# Patient Record
Sex: Male | Born: 1955
Health system: Southern US, Community
[De-identification: ages and names within clinical notes are randomized; demographics above are authoritative.]

## PROBLEM LIST (undated history)

## (undated) DIAGNOSIS — K760 Fatty (change of) liver, not elsewhere classified: Secondary | ICD-10-CM

## (undated) DIAGNOSIS — R748 Abnormal levels of other serum enzymes: Secondary | ICD-10-CM

## (undated) DIAGNOSIS — T7840XA Allergy, unspecified, initial encounter: Secondary | ICD-10-CM

## (undated) DIAGNOSIS — I10 Essential (primary) hypertension: Secondary | ICD-10-CM

## (undated) DIAGNOSIS — M109 Gout, unspecified: Secondary | ICD-10-CM

## (undated) DIAGNOSIS — Z8619 Personal history of other infectious and parasitic diseases: Secondary | ICD-10-CM

## (undated) DIAGNOSIS — H269 Unspecified cataract: Secondary | ICD-10-CM

## (undated) HISTORY — DX: Essential (primary) hypertension: I10

## (undated) HISTORY — DX: Fatty (change of) liver, not elsewhere classified: K76.0

## (undated) HISTORY — DX: Allergy, unspecified, initial encounter: T78.40XA

## (undated) HISTORY — DX: Gout, unspecified: M10.9

## (undated) HISTORY — DX: Abnormal levels of other serum enzymes: R74.8

## (undated) HISTORY — DX: Personal history of other infectious and parasitic diseases: Z86.19

## (undated) HISTORY — DX: Unspecified cataract: H26.9

## (undated) HISTORY — PX: APPENDECTOMY: SHX54

---

## 1983-06-21 HISTORY — PX: KNEE SURGERY: SHX244

## 2017-12-19 ENCOUNTER — Telehealth: Payer: Self-pay

## 2017-12-19 NOTE — Telephone Encounter (Signed)
Copied from CRM [id number]. Topic: Appointment Scheduling - Scheduling Inquiry for Clinic >> Dec 19, 2017 11:51 AM Oneal GroutSebastian, Jennifer S wrote: Reason for CRM: New pt appt scheduled for Sept w/ Dr Judie GrieveMclean Scocuzza, has bruising on chest, would like to be worked in next week. Please advise

## 2017-12-19 NOTE — Telephone Encounter (Signed)
Left message for patient to return call back. [facility] may schedulea sooner appointment.  As long as it is in a 30 minutes slot.

## 2017-12-25 ENCOUNTER — Other Ambulatory Visit: Payer: Self-pay | Admitting: Internal Medicine

## 2017-12-25 ENCOUNTER — Ambulatory Visit (INDEPENDENT_AMBULATORY_CARE_PROVIDER_SITE_OTHER): Payer: Managed Care, Other (non HMO)

## 2017-12-25 ENCOUNTER — Encounter: Payer: Self-pay | Admitting: Internal Medicine

## 2017-12-25 ENCOUNTER — Ambulatory Visit (INDEPENDENT_AMBULATORY_CARE_PROVIDER_SITE_OTHER): Payer: Managed Care, Other (non HMO) | Admitting: Internal Medicine

## 2017-12-25 VITALS — BP 164/98 | HR 104 | Temp 98.6°F | Ht 71.0 in | Wt 169.0 lb

## 2017-12-25 DIAGNOSIS — M109 Gout, unspecified: Secondary | ICD-10-CM

## 2017-12-25 DIAGNOSIS — T148XXA Other injury of unspecified body region, initial encounter: Secondary | ICD-10-CM

## 2017-12-25 DIAGNOSIS — R600 Localized edema: Secondary | ICD-10-CM | POA: Diagnosis not present

## 2017-12-25 DIAGNOSIS — M1A9XX0 Chronic gout, unspecified, without tophus (tophi): Secondary | ICD-10-CM

## 2017-12-25 DIAGNOSIS — Z720 Tobacco use: Secondary | ICD-10-CM | POA: Diagnosis not present

## 2017-12-25 DIAGNOSIS — Z1329 Encounter for screening for other suspected endocrine disorder: Secondary | ICD-10-CM | POA: Diagnosis not present

## 2017-12-25 DIAGNOSIS — F1010 Alcohol abuse, uncomplicated: Secondary | ICD-10-CM | POA: Diagnosis not present

## 2017-12-25 DIAGNOSIS — I10 Essential (primary) hypertension: Secondary | ICD-10-CM | POA: Diagnosis not present

## 2017-12-25 DIAGNOSIS — Z125 Encounter for screening for malignant neoplasm of prostate: Secondary | ICD-10-CM | POA: Diagnosis not present

## 2017-12-25 DIAGNOSIS — R748 Abnormal levels of other serum enzymes: Secondary | ICD-10-CM

## 2017-12-25 LAB — COMPREHENSIVE METABOLIC PANEL
ALT: 93 U/L — ABNORMAL HIGH (ref 0–53)
AST: 176 U/L — AB (ref 0–37)
Albumin: 4.4 g/dL (ref 3.5–5.2)
Alkaline Phosphatase: 92 U/L (ref 39–117)
BUN: 8 mg/dL (ref 6–23)
CALCIUM: 9.5 mg/dL (ref 8.4–10.5)
CO2: 27 meq/L (ref 19–32)
CREATININE: 0.84 mg/dL (ref 0.40–1.50)
Chloride: 99 mEq/L (ref 96–112)
GFR: 97.79 mL/min (ref >60.00)
Glucose, Bld: 122 mg/dL — ABNORMAL HIGH (ref 70–99)
Potassium: 3.6 mEq/L (ref 3.5–5.1)
Sodium: 140 mEq/L (ref 135–145)
Total Bilirubin: 0.6 mg/dL (ref 0.2–1.2)
Total Protein: 6.8 g/dL (ref 6.0–8.3)

## 2017-12-25 LAB — CBC WITH DIFFERENTIAL/PLATELET
Basophils Absolute: 0.1 10*3/uL (ref 0.0–0.1)
Basophils Relative: 1 % (ref 0.0–3.0)
Eosinophils Absolute: 0 10*3/uL (ref 0.0–0.7)
Eosinophils Relative: 0.2 % (ref 0.0–5.0)
HEMATOCRIT: 39.3 % (ref 39.0–52.0)
Hemoglobin: 13.6 g/dL (ref 13.0–17.0)
LYMPHS PCT: 13.5 % (ref 12.0–46.0)
Lymphs Abs: 0.8 10*3/uL (ref 0.7–4.0)
MCHC: 34.6 g/dL (ref 30.0–36.0)
MONOS PCT: 12.2 % — AB (ref 3.0–12.0)
Monocytes Absolute: 0.8 10*3/uL (ref 0.1–1.0)
NEUTROS ABS: 4.6 10*3/uL (ref 1.4–7.7)
NEUTROS PCT: 73.1 % (ref 43.0–77.0)
PLATELETS: 232 10*3/uL (ref 150.0–400.0)
RBC: 3.47 Mil/uL — ABNORMAL LOW (ref 4.22–5.81)
RDW: 15.6 % — AB (ref 11.5–15.5)
WBC: 6.2 10*3/uL (ref 4.0–10.5)

## 2017-12-25 LAB — TSH: TSH: 3.42 u[IU]/mL (ref 0.35–4.50)

## 2017-12-25 LAB — PROTIME-INR
INR: 1 ratio (ref 0.8–1.0)
PROTHROMBIN TIME: 11.9 s (ref 9.6–13.1)

## 2017-12-25 LAB — PSA: PSA: 1 ng/mL (ref 0.10–4.00)

## 2017-12-25 LAB — URIC ACID: URIC ACID, SERUM: 7 mg/dL (ref 4.0–7.8)

## 2017-12-25 LAB — APTT: APTT: 29.3 s (ref 23.4–32.7)

## 2017-12-25 MED ORDER — SPIRONOLACTONE 25 MG PO TABS: ORAL_TABLET | ORAL | 1 refills | Status: DC

## 2017-12-25 MED ORDER — FUROSEMIDE 20 MG PO TABS: 20.0000 mg | ORAL_TABLET | Freq: Every day | ORAL | 1 refills | Status: DC

## 2017-12-25 NOTE — Progress Notes (Addendum)
Chief Complaint  Patient presents with  . New Patient (Initial Visit)  . Leg Swelling  . Bleeding/Bruising   NP has not seen PCP in 8 years  1. C/o bruising to right chest and abdomen x 2 weeks ago and right elbow whole/sore which was round and bloody 2 weeks ago using neosporin. Denies h/o trauma. He also had scratch to his back w/o h/o trauma or bite, no pets at home  2. BP elevated 180/106 he has been told he had HTN in the past not seen MD in 8 years and not currently taking medications or symptomatic  3.drinking 1 bottle wine 3/7 nights a week and tobacco abuse 1/2 ppd since childhood 62 y.o quit x 10 years 2005 to 2015 no FH known adopted  4. C/o right leg edema w/in the the last 2 weeks no trauma  5. H/o gout not on meds   Review of Systems  Constitutional: Negative for weight loss.  HENT: Negative for hearing loss.   Eyes: Negative for blurred vision.  Respiratory: Negative for shortness of breath.   Cardiovascular: Positive for leg swelling. Negative for chest pain.  Gastrointestinal: Negative for abdominal pain and blood in stool.  Musculoskeletal: Negative for falls.  Skin:       +wounds skin  Neurological: Negative for headaches.  Endo/Heme/Allergies: Bruises/bleeds easily.  Psychiatric/Behavioral: The patient is nervous/anxious.    Past Medical History:  Diagnosis Date  . Allergy   . Gout   . History of chicken pox   . Hypertension    Past Surgical History:  Procedure Laterality Date  . APPENDECTOMY     1960  . KNEE SURGERY  1985   arthroscopy    Family History  Adopted: Yes   Social History   Socioeconomic History  . Marital status: Divorced    Spouse name: Not on file  . Number of children: Not on file  . Years of education: Not on file  . Highest education level: Not on file  Occupational History  . Not on file  Social Needs  . Financial resource strain: Not on file  . Food insecurity:    Worry: Not on file    Inability: Not on file  .  Transportation needs:    Medical: Not on file    Non-medical: Not on file  Tobacco Use  . Smoking status: Current Every Day Smoker    Packs/day: 0.50    Types: Cigarettes  . Smokeless tobacco: Never Used  Substance and Sexual Activity  . Alcohol use: Yes    Comment: 1 bottle wine 3/7 nights a week   . Drug use: Not Currently  . Sexual activity: Yes    Partners: Female  Lifestyle  . Physical activity:    Days per week: Not on file    Minutes per session: Not on file  . Stress: Not on file  Relationships  . Social connections:    Talks on phone: Not on file    Gets together: Not on file    Attends religious service: Not on file    Active member of club or organization: Not on file    Attends meetings of clubs or organizations: Not on file    Relationship status: Not on file  . Intimate partner violence:    Fear of current or ex partner: Not on file    Emotionally abused: Not on file    Physically abused: Not on file    Forced sexual activity: Not on file  Other Topics Concern  . Not on file  Social History Narrative   Separated    2 kids son and daughter    Event organiser    Current Meds  Medication Sig  . Multiple Vitamins-Minerals (MULTIVITAMIN ADULT PO) Take by mouth.  . NON FORMULARY Tumeric qd   Allergies  Allergen Reactions  . Penicillin G Other (See Comments)    As kid    No results found for this or any previous visit (from the past 2160 hour(s)). Objective  Body mass index is 23.57 kg/m. Wt Readings from Last 3 Encounters:  12/25/17 169 lb (76.7 kg)   Temp Readings from Last 3 Encounters:  12/25/17 98.6 F (37 C) (Oral)   BP Readings from Last 3 Encounters:  12/25/17 (!) 164/98   Pulse Readings from Last 3 Encounters:  12/25/17 (!) 104    Physical Exam  Constitutional: He is oriented to person, place, and time. He appears well-developed and well-nourished. He is cooperative.  HENT:  Head: Normocephalic and atraumatic.    Mouth/Throat: Oropharynx is clear and moist and mucous membranes are normal.  Eyes: Pupils are equal, round, and reactive to light. Conjunctivae are normal.  Cardiovascular: Regular rhythm and normal heart sounds. Tachycardia present.  Pulmonary/Chest: Effort normal and breath sounds normal.  Abdominal: Soft. Bowel sounds are normal. There is no tenderness.    Neurological: He is alert and oriented to person, place, and time. Gait normal.  Skin: Skin is warm, dry and intact.     Psychiatric: He has a normal mood and affect. His speech is normal and behavior is normal. Judgment and thought content normal. Cognition and memory are normal.  Nursing note and vitals reviewed.   Assessment   1. HTN  2. 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 Alcohol and tobacco abuse  4. RLE edema  5. HM 6. gout Plan  1. spironlactone 12.5 mg x 3 days then 25 mg qd and lasix 20 mg qd  Check labs  2. Check labs  With h/o etoh abuse consider [place] abd 3. rec reduce etoh and cigs 4. D dimer if elevated will do [place] RLE 5.  Had flu shot 2018  Consider Tdap disc today  Will disc shingrix and pna 23 vaccine in future  Consider colonoscopy pt agreeable in future   Labs today consider lipid fasting in future  Declines hep B/C/HIV testing  Smoker 1/2 ppd since childhood 62 y.o quit x 10 years 2005 to 2015 no FH known adopted  rec reduce etoh  PHQ 9 score 11 address at f/u may be related to substance   Saw eye MD Dr. Lew Dawes  6. Check uric acid   Provider: Dr. French Ana McLean-Scocuzza-Internal Medicine

## 2017-12-25 NOTE — Patient Instructions (Signed)
F/u in 2-3 weeks sooner if needed  Read info below  We will start 1/2 spironolactone x 3 days in am then 1 pill on day 4  Lasix 20 mg in am   Low-Purine Diet Purines are compounds that affect the level of uric acid in your body. A low-purine diet is a diet that is low in purines. Eating a low-purine diet can prevent the level of uric acid in your body from getting too high and causing gout or kidney stones or both. What do I need to know about this diet?  Choose low-purine foods. Examples of low-purine foods are listed in the next section.  Drink plenty of fluids, especially water. Fluids can help remove uric acid from your body. Try to drink 8-16 cups (1.9-3.8 L) a day.  Limit foods high in fat, especially saturated fat, as fat makes it harder for the body to get rid of uric acid. Foods high in saturated fat include pizza, cheese, ice cream, whole milk, fried foods, and gravies. Choose foods that are lower in fat and lean sources of protein. Use olive oil when cooking as it contains healthy fats that are not high in saturated fat.  Limit alcohol. Alcohol interferes with the elimination of uric acid from your body. If you are having a gout attack, avoid all alcohol.  Keep in mind that different people's bodies react differently to different foods. You will probably learn over time which foods do or do not affect you. If you discover that a food tends to cause your gout to flare up, avoid eating that food. You can more freely enjoy foods that do not cause problems. If you have any questions about a food item, talk to your dietitian or health care provider. Which foods are low, moderate, and high in purines? The following is a list of foods that are low, moderate, and high in purines. You can eat any amount of the foods that are low in purines. You may be able to have small amounts of foods that are moderate in purines. Ask your health care provider how much of a food moderate in purines you can  have. Avoid foods high in purines. Grains  Foods low in purines: Enriched white bread, pasta, rice, cake, cornbread, popcorn.  Foods moderate in purines: Whole-grain breads and cereals, wheat germ, bran, oatmeal. Uncooked oatmeal. Dry wheat bran or wheat germ.  Foods high in purines: Pancakes, [place] toast, biscuits, muffins. Vegetables  Foods low in purines: All vegetables, except those that are moderate in purines.  Foods moderate in purines: Asparagus, cauliflower, spinach, mushrooms, green peas. Fruits  All fruits are low in purines. Meats and other Protein Foods  Foods low in purines: Eggs, nuts, peanut butter.  Foods moderate in purines: 80-90% lean beef, lamb, veal, pork, poultry, fish, eggs, peanut butter, nuts. Crab, lobster, oysters, and shrimp. Cooked dried beans, peas, and lentils.  Foods high in purines: Anchovies, sardines, herring, mussels, tuna, codfish, scallops, trout, and haddock. Vincent Hayes. Organ meats (such as liver or kidney). Tripe. Game meat. Goose. Sweetbreads. Dairy  All dairy foods are low in purines. Low-fat and fat-free dairy products are best because they are low in saturated fat. Beverages  Drinks low in purines: Water, carbonated beverages, tea, coffee, cocoa.  Drinks moderate in purines: Soft drinks and other drinks sweetened with high-fructose corn syrup. Juices. To find whether a food or drink is sweetened with high-fructose corn syrup, look at the ingredients list.  Drinks high in purines: Alcoholic beverages (  such as beer). Condiments  Foods low in purines: Salt, herbs, olives, pickles, relishes, vinegar.  Foods moderate in purines: Butter, margarine, oils, mayonnaise. Fats and Oils  Foods low in purines: All types, except gravies and sauces made with meat.  Foods high in purines: Gravies and sauces made with meat. Other Foods  Foods low in purines: Sugars, sweets, gelatin. Cake. Soups made without meat.  Foods moderate in purines:  Meat-based or fish-based soups, broths, or bouillons. Foods and drinks sweetened with high-fructose corn syrup.  Foods high in purines: High-fat desserts (such as ice cream, cookies, cakes, pies, doughnuts, and chocolate). Contact your dietitian for more information on foods that are not listed here. This information is not intended to replace advice given to you by your health care provider. Make sure you discuss any questions you have with your health care provider. Document Released: 10/01/2010 Document Revised: 11/12/2015 Document Reviewed: 05/13/2013 Elsevier Interactive Patient Education  2017 Else[employer] Inc.  Tdap/DTaP Vaccine (Diphtheria, Tetanus, and Pertussis): What You Need to Know 1. Why get vaccinated? Diphtheria, tetanus, and pertussis are serious diseases caused by bacteria. Diphtheria and pertussis are spread from person to person. Tetanus enters the body through cuts or wounds. DIPHTHERIA causes a thick covering in the back of the throat.  It can lead to breathing problems, paralysis, heart failure, and even death.  TETANUS (Lockjaw) causes painful tightening of the muscles, usually all over the body.  It can lead to "locking" of the jaw so the victim cannot open his mouth or swallow. Tetanus leads to death in up to 2 out of 10 cases.  PERTUSSIS (Whooping Cough) causes coughing spells so bad that it is hard for infants to eat, drink, or breathe. These spells can last for weeks.  It can lead to pneumonia, seizures (jerking and staring spells), brain damage, and death.  Diphtheria, tetanus, and pertussis vaccine (DTaP) can help prevent these diseases. Most children who are vaccinated with DTaP will be protected throughout childhood. Many more children would get these diseases if we stopped vaccinating. DTaP is a safer version of an older vaccine called DTP. DTP is no longer used in the [place]. 2. Who should get DTaP vaccine and when? Children should get 5 doses of DTaP  vaccine, one dose at each of the following ages:  2 months  4 months  6 months  15-18 months  4-6 years  DTaP may be given at the same time as other vaccines. 3. Some children should not get DTaP vaccine or should wait  Children with minor illnesses, such as a cold, may be vaccinated. But children who are moderately or severely ill should usually wait until they recover before getting DTaP vaccine.  Any child who had a life-threatening allergic reaction after a dose of DTaP should not get another dose.  Any child who suffered a brain or nervous system disease within 7 days after a dose of DTaP should not get another dose.  Talk with your doctor if your child: ? had a seizure or collapsed after a dose of DTaP, ? cried non-stop for 3 hours or more after a dose of DTaP, ? had a fever over 105F after a dose of DTaP. Ask your doctor for more information. Some of these children should not get another dose of pertussis vaccine, but may get a vaccine without pertussis, called DT. 4. Older children and adults DTaP is not licensed for adolescents, adults, or children 507 years of age and older. But older people  still need protection. A vaccine called Tdap is similar to DTaP. A single dose of Tdap is recommended for people 11 through 64 years of age. Another vaccine, called Td, protects against tetanus and diphtheria, but not pertussis. It is recommended every 10 years. There are separate Vaccine Information Statements for these vaccines. 5. What are the risks from DTaP vaccine? Getting diphtheria, tetanus, or pertussis disease is much riskier than getting DTaP vaccine. However, a vaccine, like any medicine, is capable of causing serious problems, such as severe allergic reactions. The risk of DTaP vaccine causing serious harm, or death, is extremely small. Mild problems (common)  Fever (up to about 1 child in 4)  Redness or swelling where the shot was given (up to about 1 child in  4)  Soreness or tenderness where the shot was given (up to about 1 child in 4) These problems occur more often after the 4th and 5th doses of the DTaP series than after earlier doses. Sometimes the 4th or 5th dose of DTaP vaccine is followed by swelling of the entire arm or leg in which the shot was given, lasting 1-7 days (up to about 1 child in 30). Other mild problems include:  Fussiness (up to about 1 child in 3)  Tiredness or poor appetite (up to about 1 child in 10)  Vomiting (up to about 1 child in 50) These problems generally occur 1-3 days after the shot. Moderate problems (uncommon)  Seizure (jerking or staring) (about 1 child out of 14,000)  Non-stop crying, for 3 hours or more (up to about 1 child out of 1,000)  High fever, over 105F (about 1 child out of 16,000) Severe problems (very rare)  Serious allergic reaction (less than 1 out of a million doses)  Several other severe problems have been reported after DTaP vaccine. These include: ? Long-term seizures, coma, or lowered consciousness ? Permanent brain damage. These are so rare it is hard to tell if they are caused by the vaccine. Controlling fever is especially important for children who have had seizures, for any reason. It is also important if another family member has had seizures. You can reduce fever and pain by giving your child an aspirin-free pain reliever when the shot is given, and for the next 24 hours, following the package instructions. 6. What if there is a serious reaction? What should I look for? Look for anything that concerns you, such as signs of a severe allergic reaction, very high fever, or behavior changes. Signs of a severe allergic reaction can include hives, swelling of the face and throat, difficulty breathing, a fast heartbeat, dizziness, and weakness. These would start a few minutes to a few hours after the vaccination. What should I do?  If you think it is a severe allergic reaction or  other emergency that can't wait, call 9-1-1 or get the person to the nearest hospital. Otherwise, call your doctor.  Afterward, the reaction should be reported to the Vaccine Adverse Event Reporting System (VAERS). Your doctor might file this report, or you can do it yourself through the VAERS web site at www.vaers.LAgents.no, or by calling 1-430-557-5958. ? VAERS is only for reporting reactions. They do not give medical advice. 7. The National Vaccine Injury Compensation Program The [employer] Vaccine Injury Compensation Program (VICP) is a federal program that was created to compensate people who may have been injured by certain vaccines. Persons who believe they may have been injured by a vaccine can learn about the program  and about filing a claim by calling 1-970-543-0967 or visiting the VICP website at SpiritualWord.at. 8. How can I learn more?  Ask your doctor.  Call your local or state health department.  Contact the Centers for Disease Control and Prevention (CDC): ? Call 208-087-9609 (1-800-CDC-INFO) or ? Visit CDC's website at PicCapture.uy CDC DTaP Vaccine (Diphtheria, Tetanus, and Pertussis) VIS (11/03/05) This information is not intended to replace advice given to you by your health care provider. Make sure you discuss any questions you have with your health care provider. Document Released: 04/03/2006 Document Revised: 02/25/2016 Document Reviewed: 02/25/2016 Elsevier Interactive Patient Education  2017 Elsevier Inc.  Hypertension Hypertension, commonly called high blood pressure, is when the force of blood pumping through the arteries is too strong. The arteries are the blood vessels that carry blood from the heart throughout the body. Hypertension forces the heart to work harder to pump blood and may cause arteries to become narrow or stiff. Having untreated or uncontrolled hypertension can cause heart attacks, strokes, kidney disease, and other problems. A  blood pressure reading consists of a higher number over a lower number. Ideally, your blood pressure should be below 120/80. The first ("top") number is called the systolic pressure. It is a measure of the pressure in your arteries as your heart beats. The second ("bottom") number is called the diastolic pressure. It is a measure of the pressure in your arteries as the heart relaxes. What are the causes? The cause of this condition is not known. What increases the risk? Some risk factors for high blood pressure are under your control. Others are not. Factors you can change  Smoking.  Having type 2 diabetes mellitus, high cholesterol, or both.  Not getting enough exercise or physical activity.  Being overweight.  Having too much fat, sugar, calories, or salt (sodium) in your diet.  Drinking too much alcohol. Factors that are difficult or impossible to change  Having chronic kidney disease.  Having a family history of high blood pressure.  Age. Risk increases with age.  Race. You may be at higher risk if you are African-American.  Gender. Men are at higher risk than women before age 36. After age 75, women are at higher risk than men.  Having obstructive sleep apnea.  Stress. What are the signs or symptoms? Extremely high blood pressure (hypertensive crisis) may cause:  Headache.  Anxiety.  Shortness of breath.  Nosebleed.  Nausea and vomiting.  Severe chest pain.  Jerky movements you cannot control (seizures).  How is this diagnosed? This condition is diagnosed by measuring your blood pressure while you are seated, with your arm resting on a surface. The cuff of the blood pressure monitor will be placed directly against the skin of your upper arm at the level of your heart. It should be measured at least twice using the same arm. Certain conditions can cause a difference in blood pressure between your right and left arms. Certain factors can cause blood pressure  readings to be lower or higher than normal (elevated) for a short period of time:  When your blood pressure is higher when you are in a health care provider's office than when you are at home, this is called white coat hypertension. Most people with this condition do not need medicines.  When your blood pressure is higher at home than when you are in a health care provider's office, this is called masked hypertension. Most people with this condition may need medicines to control blood pressure.  If you have a high blood pressure reading during one visit or you have normal blood pressure with other risk factors:  You may be asked to return on a different day to have your blood pressure checked again.  You may be asked to monitor your blood pressure at home for 1 week or longer.  If you are diagnosed with hypertension, you may have other blood or imaging tests to help your health care provider understand your overall risk for other conditions. How is this treated? This condition is treated by making healthy lifestyle changes, such as eating healthy foods, exercising more, and reducing your alcohol intake. Your health care provider may prescribe medicine if lifestyle changes are not enough to get your blood pressure under control, and if:  Your systolic blood pressure is above 130.  Your diastolic blood pressure is above 80.  Your personal target blood pressure may vary depending on your medical conditions, your age, and other factors. Follow these instructions at home: Eating and drinking  Eat a diet that is high in fiber and potassium, and low in sodium, added sugar, and fat. An example eating plan is called the DASH (Dietary Approaches to Stop Hypertension) diet. To eat this way: ? Eat plenty of fresh fruits and vegetables. Try to fill half of your plate at each meal with fruits and vegetables. ? Eat whole grains, such as whole wheat pasta, brown rice, or whole grain bread. Fill about one  quarter of your plate with whole grains. ? Eat or drink low-fat dairy products, such as skim milk or low-fat yogurt. ? Avoid fatty cuts of meat, processed or cured meats, and poultry with skin. Fill about one quarter of your plate with lean proteins, such as fish, chicken without skin, beans, eggs, and tofu. ? Avoid premade and processed foods. These tend to be higher in sodium, added sugar, and fat.  Reduce your daily sodium intake. Most people with hypertension should eat less than 1,500 mg of sodium a day.  Limit alcohol intake to no more than 1 drink a day for nonpregnant women and 2 drinks a day for men. One drink equals 12 oz of beer, 5 oz of wine, or 1 oz of hard liquor. Lifestyle  Work with your health care provider to maintain a healthy body weight or to lose weight. Ask what an ideal weight is for you.  Get at least 30 minutes of exercise that causes your heart to beat faster (aerobic exercise) most days of the week. Activities may include walking, swimming, or biking.  Include exercise to strengthen your muscles (resistance exercise), such as pilates or lifting weights, as part of your weekly exercise routine. Try to do these types of exercises for 30 minutes at least 3 days a week.  Do not use any products that contain nicotine or tobacco, such as cigarettes and e-cigarettes. If you need help quitting, ask your health care provider.  Monitor your blood pressure at home as told by your health care provider.  Keep all follow-up visits as told by your health care provider. This is important. Medicines  Take over-the-counter and prescription medicines only as told by your health care provider. Follow directions carefully. Blood pressure medicines must be taken as prescribed.  Do not skip doses of blood pressure medicine. Doing this puts you at risk for problems and can make the medicine less effective.  Ask your health care provider about side effects or reactions to medicines  that you should watch for. Contact  a health care provider if:  You think you are having a reaction to a medicine you are taking.  You have headaches that keep coming back (recurring).  You feel dizzy.  You have swelling in your ankles.  You have trouble with your vision. Get help right away if:  You develop a severe headache or confusion.  You have unusual weakness or numbness.  You feel faint.  You have severe pain in your chest or abdomen.  You vomit repeatedly.  You have trouble breathing. Summary  Hypertension is when the force of blood pumping through your arteries is too strong. If this condition is not controlled, it may put you at risk for serious complications.  Your personal target blood pressure may vary depending on your medical conditions, your age, and other factors. For most people, a normal blood pressure is less than 120/80.  Hypertension is treated with lifestyle changes, medicines, or a combination of both. Lifestyle changes include weight loss, eating a healthy, low-sodium diet, exercising more, and limiting alcohol. This information is not intended to replace advice given to you by your health care provider. Make sure you discuss any questions you have with your health care provider. Document Released: 06/06/2005 Document Revised: 05/04/2016 Document Reviewed: 05/04/2016 Elsevier Interactive Patient Education  [employer].

## 2017-12-25 NOTE — Progress Notes (Signed)
Pre visit review using our clinic review tool, if applicable. No additional management support is needed unless otherwise documented below in the visit note. 

## 2017-12-26 ENCOUNTER — Telehealth: Payer: Self-pay | Admitting: Internal Medicine

## 2017-12-26 ENCOUNTER — Other Ambulatory Visit: Payer: Self-pay | Admitting: Internal Medicine

## 2017-12-26 DIAGNOSIS — R600 Localized edema: Secondary | ICD-10-CM

## 2017-12-26 LAB — URINALYSIS, ROUTINE W REFLEX MICROSCOPIC
BILIRUBIN UA: NEGATIVE
GLUCOSE, UA: NEGATIVE
LEUKOCYTES UA: NEGATIVE
Nitrite, UA: NEGATIVE
PH UA: 5.5 (ref 5.0–7.5)
RBC UA: NEGATIVE
SPEC GRAV UA: 1.028 (ref 1.005–1.030)
UUROB: 1 mg/dL (ref 0.2–1.0)

## 2017-12-26 LAB — MICROSCOPIC EXAMINATION
Casts: NONE SEEN /lpf
EPITHELIAL CELLS (NON RENAL): NONE SEEN /HPF (ref 0–10)

## 2017-12-26 LAB — D-DIMER, QUANTITATIVE: D-Dimer, Quant: 1.99 mcg/mL FEU — ABNORMAL HIGH (ref <0.50)

## 2017-12-26 NOTE — Telephone Encounter (Signed)
Copied from CRM [id number]. Topic: Quick Communication - Lab Results >> Dec 26, 2017 11:07 AM Bronwen BettersBooth, Brock T, CMA wrote: Called patient to inform them of [id number] lab results. When patient returns call, triage nurse may disclose results.  patient is calling in reference to lab results.  CB# [phone]

## 2017-12-26 NOTE — Telephone Encounter (Signed)
Charted in result notes. 

## 2017-12-27 ENCOUNTER — Ambulatory Visit
Admission: RE | Admit: 2017-12-27 | Discharge: 2017-12-27 | Disposition: A | Discharge status: Home / Self Care | Payer: Managed Care, Other (non HMO) | Source: Ambulatory Visit | Attending: Internal Medicine | Admitting: Internal Medicine

## 2017-12-27 DIAGNOSIS — R609 Edema, unspecified: Secondary | ICD-10-CM | POA: Insufficient documentation

## 2017-12-27 DIAGNOSIS — R600 Localized edema: Secondary | ICD-10-CM

## 2017-12-27 DIAGNOSIS — R7989 Other specified abnormal findings of blood chemistry: Secondary | ICD-10-CM | POA: Insufficient documentation

## 2017-12-29 ENCOUNTER — Ambulatory Visit
Admission: RE | Admit: 2017-12-29 | Discharge: 2017-12-29 | Disposition: A | Discharge status: Home / Self Care | Payer: Managed Care, Other (non HMO) | Source: Ambulatory Visit | Attending: Internal Medicine | Admitting: Internal Medicine

## 2017-12-29 DIAGNOSIS — K769 Liver disease, unspecified: Secondary | ICD-10-CM | POA: Diagnosis not present

## 2017-12-29 DIAGNOSIS — R748 Abnormal levels of other serum enzymes: Secondary | ICD-10-CM | POA: Diagnosis not present

## 2018-01-08 ENCOUNTER — Ambulatory Visit (INDEPENDENT_AMBULATORY_CARE_PROVIDER_SITE_OTHER): Payer: Managed Care, Other (non HMO) | Admitting: Internal Medicine

## 2018-01-08 ENCOUNTER — Encounter: Payer: Self-pay | Admitting: Internal Medicine

## 2018-01-08 VITALS — BP 134/88 | HR 89 | Temp 98.1°F | Ht 71.0 in | Wt 164.2 lb

## 2018-01-08 DIAGNOSIS — Z720 Tobacco use: Secondary | ICD-10-CM

## 2018-01-08 DIAGNOSIS — R609 Edema, unspecified: Secondary | ICD-10-CM

## 2018-01-08 DIAGNOSIS — I10 Essential (primary) hypertension: Secondary | ICD-10-CM | POA: Diagnosis not present

## 2018-01-08 DIAGNOSIS — R7989 Other specified abnormal findings of blood chemistry: Secondary | ICD-10-CM

## 2018-01-08 DIAGNOSIS — R809 Proteinuria, unspecified: Secondary | ICD-10-CM

## 2018-01-08 DIAGNOSIS — F1010 Alcohol abuse, uncomplicated: Secondary | ICD-10-CM | POA: Diagnosis not present

## 2018-01-08 DIAGNOSIS — R945 Abnormal results of liver function studies: Secondary | ICD-10-CM

## 2018-01-08 DIAGNOSIS — Z1211 Encounter for screening for malignant neoplasm of colon: Secondary | ICD-10-CM

## 2018-01-08 DIAGNOSIS — R739 Hyperglycemia, unspecified: Secondary | ICD-10-CM | POA: Diagnosis not present

## 2018-01-08 LAB — HEMOCCULT GUIAC POC 1CARD (OFFICE): FECAL OCCULT BLD: NEGATIVE

## 2018-01-08 LAB — HEMOGLOBIN A1C: HEMOGLOBIN A1C: 5.1 % (ref 4.6–6.5)

## 2018-01-08 MED ORDER — FUROSEMIDE 20 MG PO TABS: 20.0000 mg | ORAL_TABLET | Freq: Every day | ORAL | 1 refills | Status: DC

## 2018-01-08 MED ORDER — SPIRONOLACTONE 25 MG PO TABS: 25.0000 mg | ORAL_TABLET | Freq: Every day | ORAL | 1 refills | Status: DC

## 2018-01-08 NOTE — Progress Notes (Signed)
Pre visit review using our clinic review tool, if applicable. No additional management support is needed unless otherwise documented below in the visit note. 

## 2018-01-08 NOTE — Progress Notes (Addendum)
Chief Complaint  Patient presents with  . Follow-up   F/u  1. Reviewed labs elevated lfts not had etoh on 5 days liver cysts ? Too small to characterize pt wants to think about MRI [place] + fatty liver  2. HTN improved  3. Leg edema improved RLE edema improved on lasix 20 and spironolactone 25 mg qd right leg DVT neg     Review of Systems  Constitutional: Positive for weight loss.       Down 5 lbs    HENT: Negative for hearing loss.   Eyes: Negative for blurred vision.  Respiratory: Negative for shortness of breath.   Cardiovascular: Negative for chest pain.  Skin: Negative for rash.  Neurological: Negative for headaches.  Psychiatric/Behavioral: Negative for depression.   Past Medical History:  Diagnosis Date  . Allergy   . Cataract    mild Dr. Lew Dawes   . Gout   . History of chicken pox   . Hypertension    Past Surgical History:  Procedure Laterality Date  . APPENDECTOMY     1960  . KNEE SURGERY  1985   arthroscopy    Family History  Adopted: Yes   Social History   Socioeconomic History  . Marital status: Legally Separated    Spouse name: Not on file  . Number of children: Not on file  . Years of education: Not on file  . Highest education level: Not on file  Occupational History  . Not on file  Social Needs  . Financial resource strain: Not on file  . Food insecurity:    Worry: Not on file    Inability: Not on file  . Transportation needs:    Medical: Not on file    Non-medical: Not on file  Tobacco Use  . Smoking status: Current Every Day Smoker    Packs/day: 0.50    Types: Cigarettes  . Smokeless tobacco: Never Used  Substance and Sexual Activity  . Alcohol use: Yes    Comment: 1 bottle wine 3/7 nights a week   . Drug use: Not Currently  . Sexual activity: Yes    Partners: Female  Lifestyle  . Physical activity:    Days per week: Not on file    Minutes per session: Not on file  . Stress: Not on file  Relationships  . Social connections:     Talks on phone: Not on file    Gets together: Not on file    Attends religious service: Not on file    Active member of club or organization: Not on file    Attends meetings of clubs or organizations: Not on file    Relationship status: Not on file  . Intimate partner violence:    Fear of current or ex partner: Not on file    Emotionally abused: Not on file    Physically abused: Not on file    Forced sexual activity: Not on file  Other Topics Concern  . Not on file  Social History Narrative   Separated    2 kids son and daughter    Event organiser    Owns guns, wears seat belt, safe in relationship    Current Meds  Medication Sig  . furosemide (LASIX) 20 MG tablet Take 1 tablet (20 mg total) by mouth daily. In am  . Multiple Vitamins-Minerals (MULTIVITAMIN ADULT PO) Take by mouth.  . NON FORMULARY Tumeric qd  . spironolactone (ALDACTONE) 25 MG tablet 1/2 pill  x 3 days then 1 pill daily in the am   Allergies  Allergen Reactions  . Penicillin G Other (See Comments)    As kid    Recent Results (from the past 2160 hour(s))  Comprehensive metabolic panel     Status: Abnormal   Collection Time: 12/25/17  2:26 PM  Result Value Ref Range   Sodium 140 135 - 145 mEq/L   Potassium 3.6 3.5 - 5.1 mEq/L   Chloride 99 96 - 112 mEq/L   CO2 27 19 - 32 mEq/L   Glucose, Bld 122 (H) 70 - 99 mg/dL   BUN 8 6 - 23 mg/dL   Creatinine, Ser 1.61 0.40 - 1.50 mg/dL   Total Bilirubin 0.6 0.2 - 1.2 mg/dL   Alkaline Phosphatase 92 39 - 117 U/L   AST 176 (H) 0 - 37 U/L   ALT 93 (H) 0 - 53 U/L   Total Protein 6.8 6.0 - 8.3 g/dL   Albumin 4.4 3.5 - 5.2 g/dL   Calcium 9.5 8.4 - 09.6 mg/dL   GFR 04.54 >09.81 mL/min  CBC with Differential/Platelet     Status: Abnormal   Collection Time: 12/25/17  2:26 PM  Result Value Ref Range   WBC 6.2 4.0 - 10.5 K/uL   RBC 3.47 (L) 4.22 - 5.81 Mil/uL   Hemoglobin 13.6 13.0 - 17.0 g/dL   HCT 19.1 47.8 - 29.5 %   MCV 113.4 Repeated and verified  X2. (H) 78.0 - 100.0 fl   MCHC 34.6 30.0 - 36.0 g/dL   RDW 62.1 (H) 30.8 - 65.7 %   Platelets 232.0 150.0 - 400.0 K/uL   Neutrophils Relative % 73.1 43.0 - 77.0 %   Lymphocytes Relative 13.5 12.0 - 46.0 %   Monocytes Relative 12.2 (H) 3.0 - 12.0 %   Eosinophils Relative 0.2 0.0 - 5.0 %   Basophils Relative 1.0 0.0 - 3.0 %   Neutro Abs 4.6 1.4 - 7.7 K/uL   Lymphs Abs 0.8 0.7 - 4.0 K/uL   Monocytes Absolute 0.8 0.1 - 1.0 K/uL   Eosinophils Absolute 0.0 0.0 - 0.7 K/uL   Basophils Absolute 0.1 0.0 - 0.1 K/uL  TSH     Status: None   Collection Time: 12/25/17  2:26 PM  Result Value Ref Range   TSH 3.42 0.35 - 4.50 uIU/mL  Uric acid     Status: None   Collection Time: 12/25/17  2:26 PM  Result Value Ref Range   Uric Acid, Serum 7.0 4.0 - 7.8 mg/dL  INR/PT     Status: None   Collection Time: 12/25/17  2:26 PM  Result Value Ref Range   INR 1.0 0.8 - 1.0 ratio   Prothrombin Time 11.9 9.6 - 13.1 sec  PTT     Status: None   Collection Time: 12/25/17  2:26 PM  Result Value Ref Range   aPTT 29.3 23.4 - 32.7 SEC  PSA     Status: None   Collection Time: 12/25/17  2:26 PM  Result Value Ref Range   PSA 1.00 0.10 - 4.00 ng/mL    Comment: Test performed using Access Hybritech PSA Assay, a parmagnetic partical, chemiluminecent immunoassay.  Urinalysis, Routine w reflex microscopic     Status: Abnormal   Collection Time: 12/25/17  2:27 PM  Result Value Ref Range   Specific Gravity, UA 1.028 1.005 - 1.030   pH, UA 5.5 5.0 - 7.5   Color, UA Yellow Yellow   Appearance Ur Turbid (  A) Clear   Leukocytes, UA Negative Negative   Protein, UA 1+ (A) Negative/Trace   Glucose, UA Negative Negative   Ketones, UA Trace (A) Negative   RBC, UA Negative Negative   Bilirubin, UA Negative Negative   Urobilinogen, Ur 1.0 0.2 - 1.0 mg/dL   Nitrite, UA Negative Negative   Microscopic Examination See below:     Comment: Microscopic was indicated and was performed.  D-Dimer, Quantitative     Status: Abnormal    Collection Time: 12/25/17  2:27 PM  Result Value Ref Range   D-Dimer, Quant 1.99 (H) <0.50 mcg/mL FEU    Comment: . The D-Dimer test is used frequently to exclude an acute PE or DVT. In patients with a low to moderate clinical risk assessment and a D-Dimer result <0.50 mcg/mL FEU, the likelihood of a PE or DVT is very low. However, a thromboembolic event should not be excluded solely on the basis of the D-Dimer level. Increased levels of D-Dimer are associated with a PE, DVT, DIC, malignancies, inflammation, sepsis, surgery, trauma, pregnancy, and advancing patient age. [Jama 2006 11:295(2):199-207] . For additional information, please refer to: http://education.questdiagnostics.com/faq/FAQ149 (This link is being provided for informational/ educational purposes only) .   Microscopic Examination     Status: None   Collection Time: 12/25/17  2:27 PM  Result Value Ref Range   WBC, UA 0-5 0 - 5 /hpf   RBC, UA 0-2 0 - 2 /hpf   Epithelial Cells (non renal) None seen 0 - 10 /hpf   Casts None seen None seen /lpf   Mucus, UA Present Not Estab.   Bacteria, UA Few None seen/Few   Objective  Body mass index is 22.9 kg/m. Wt Readings from Last 3 Encounters:  01/08/18 164 lb 3.2 oz (74.5 kg)  12/25/17 169 lb (76.7 kg)   Temp Readings from Last 3 Encounters:  01/08/18 98.1 F (36.7 C) (Oral)  12/25/17 98.6 F (37 C) (Oral)   BP Readings from Last 3 Encounters:  01/08/18 134/88  12/25/17 (!) 164/98   Pulse Readings from Last 3 Encounters:  01/08/18 89  12/25/17 (!) 104    Physical Exam  Constitutional: He is oriented to person, place, and time. Vital signs are normal. He appears well-developed and well-nourished. He is cooperative.  HENT:  Head: Normocephalic and atraumatic.  Mouth/Throat: Oropharynx is clear and moist and mucous membranes are normal.  Eyes: Pupils are equal, round, and reactive to light. Conjunctivae are normal.  Cardiovascular: Normal rate, regular  rhythm and normal heart sounds.  Pulmonary/Chest: Effort normal and breath sounds normal.  Genitourinary: Prostate normal. Rectal exam shows external hemorrhoid. Rectal exam shows guaiac negative stool. Prostate is not enlarged and not tender.  Neurological: He is alert and oriented to person, place, and time. Gait normal.  Skin: Skin is warm, dry and intact.  Psychiatric: He has a normal mood and affect. His speech is normal and behavior is normal. Judgment and thought content normal. Cognition and memory are normal.  Nursing note and vitals reviewed.   Assessment   1. Abnormal lfts and fatty liver ? Alcoholic hepatitis and ? liver cysts to small to characterize  2. HTN/edema resolved  3. HM Plan  1.  Consider MRI w/ and w/o pt to think about  Repeat lfts today with hep panel  2. Cont meds  Given high K list   3.  Had flu shot 2018  Consider Tdap disc today again pt to think about it  Will disc  shingrix and pna 23 vaccine in future  Consider colonoscopy pt agreeable in future pt still wants to wait today   Labs today consider lipid fasting in future  Declines HIV testing check hep A/B/C today Smoker 1/2 ppd since childhood 62 y.o quit x 10 years 2005 to 2015 no FH known adopted  rec reduce etoh congratulated on etoh x 5 days  PHQ 9 score 11 previous now 4 today  Nl psa and DRE today and neg fobt    Provider: Dr. French Ana McLean-Scocuzza-[facility]

## 2018-01-08 NOTE — Patient Instructions (Addendum)
Consider MRI abdomen with and without contrast call your insurance to see which facility they prefer  Please stop alcohol  Please take multivitamin with B1, B12 and folic acid   Nonalcoholic Fatty Liver Disease Diet Nonalcoholic fatty liver disease is a condition that causes fat to accumulate in and around the liver. The disease makes it harder for the liver to work the way that it should. Following a healthy diet can help to keep nonalcoholic fatty liver disease under control. It can also help to prevent or improve conditions that are associated with the disease, such as heart disease, diabetes, high blood pressure, and abnormal cholesterol levels. Along with regular exercise, this diet:  Promotes weight loss.  Helps to control blood sugar levels.  Helps to improve the way that the body uses insulin.  What do I need to know about this diet?  Use the glycemic index (GI) to plan your meals. The index tells you how quickly a food will raise your blood sugar. Choose low-GI foods. These foods take a longer time to raise blood sugar.  Keep track of how many calories you take in. Eating the right amount of calories will help you to achieve a healthy weight.  You may want to follow a Mediterranean diet. This diet includes a lot of vegetables, lean meats or fish, whole grains, fruits, and healthy oils and fats. What foods can I eat? Grains Whole grains, such as whole-wheat or whole-grain breads, crackers, tortillas, cereals, and pasta. Stone-ground whole wheat. Pumpernickel bread. Unsweetened oatmeal. Bulgur. Barley. Quinoa. Brown or wild rice. Corn or whole-wheat flour tortillas. Vegetables Lettuce. Spinach. Peas. Beets. Cauliflower. Cabbage. Broccoli. Carrots. Tomatoes. Squash. Eggplant. Herbs. Peppers. Onions. Cucumbers. Brussels sprouts. Yams and sweet potatoes. Beans. Lentils. Fruits Bananas. Apples. Oranges. Grapes. Papaya. Mango. Pomegranate. Kiwi. Grapefruit. Cherries. Meats and Other  Protein Sources Seafood and shellfish. Lean meats. Poultry. Tofu. Dairy Low-fat or fat-free dairy products, such as yogurt, cottage cheese, and cheese. Beverages Water. Sugar-free drinks. Tea. Coffee. Low-fat or skim milk. Milk alternatives, such as soy or almond milk. Real fruit juice. Condiments Mustard. Relish. Low-fat, low-sugar ketchup and barbecue sauce. Low-fat or fat-free mayonnaise. Sweets and Desserts Sugar-free sweets. Fats and Oils Avocado. Canola or olive oil. Nuts and nut butters. Seeds. The items listed above may not be a complete list of recommended foods or beverages. Contact your dietitian for more options. What foods are not recommended? Palm oil and coconut oil. Processed foods. Fried foods. Sweetened drinks, such as sweet tea, milkshakes, snow cones, iced sweet drinks, and sodas. Alcohol. Sweets. Foods that contain a lot of salt or sodium. The items listed above may not be a complete list of foods and beverages to avoid. Contact your dietitian for more information. This information is not intended to replace advice given to you by your health care provider. Make sure you discuss any questions you have with your health care provider. Document Released: 10/21/2014 Document Revised: 11/12/2015 Document Reviewed: 07/01/2014 Elsevier Interactive Patient Education  2018 Elsevier Inc.   Fatty Liver Fatty liver, also called hepatic steatosis or steatohepatitis, is a condition in which too much fat has built up in your liver cells. The liver removes harmful substances from your bloodstream. It produces fluids your body needs. It also helps your body use and store energy from the food you eat. In many cases, fatty liver does not cause symptoms or problems. It is often diagnosed when tests are being done for other reasons. However, over time, fatty liver can cause  inflammation that may lead to more serious liver problems, such as scarring of the liver (cirrhosis). What are the  causes? Causes of fatty liver may include:  Drinking too much alcohol.  Poor nutrition.  Obesity.  Cushing syndrome.  Diabetes.  Hyperlipidemia.  Pregnancy.  Certain drugs.  Poisons.  Some viral infections.  What increases the risk? You may be more likely to develop fatty liver if you:  Abuse alcohol.  Are pregnant.  Are overweight.  Have diabetes.  Have hepatitis.  Have a high triglyceride level.  What are the signs or symptoms? Fatty liver often does not cause any symptoms. In cases where symptoms develop, they can include:  Fatigue.  Weakness.  Weight loss.  Confusion.  Abdominal pain.  Yellowing of your skin and the white parts of your eyes (jaundice).  Nausea and vomiting.  How is this diagnosed? Fatty liver may be diagnosed by:  Physical exam and medical history.  Blood tests.  Imaging tests, such as an ultrasound, CT scan, or MRI.  Liver biopsy. A small sample of liver tissue is removed using a needle. The sample is then looked at under a microscope.  How is this treated? Fatty liver is often caused by other health conditions. Treatment for fatty liver may involve medicines and lifestyle changes to manage conditions such as:  Alcoholism.  High cholesterol.  Diabetes.  Being overweight or obese.  Follow these instructions at home:  Eat a healthy diet as directed by your health care provider.  Exercise regularly. This can help you lose weight and control your cholesterol and diabetes. Talk to your health care provider about an exercise plan and which activities are best for you.  Do not drink alcohol.  Take medicines only as directed by your health care provider. Contact a health care provider if: You have difficulty controlling your:  Blood sugar.  Cholesterol.  Alcohol consumption.  Get help right away if:  You have abdominal pain.  You have jaundice.  You have nausea and vomiting. This information is not  intended to replace advice given to you by your health care provider. Make sure you discuss any questions you have with your health care provider. Document Released: 07/22/2005 Document Revised: 11/12/2015 Document Reviewed: 10/16/2013 Elsevier Interactive Patient Education  [employer]2018 Elsevier Inc.

## 2018-01-09 ENCOUNTER — Telehealth: Payer: Self-pay

## 2018-01-09 LAB — COMPLETE METABOLIC PANEL WITH GFR
AG RATIO: 1.8 (calc) (ref 1.0–2.5)
ALBUMIN MSPROF: 4.6 g/dL (ref 3.6–5.1)
ALKALINE PHOSPHATASE (APISO): 78 U/L (ref 40–115)
ALT: 46 U/L (ref 9–46)
AST: 42 U/L — ABNORMAL HIGH (ref 10–35)
BILIRUBIN TOTAL: 0.5 mg/dL (ref 0.2–1.2)
BUN: 12 mg/dL (ref 7–25)
CHLORIDE: 97 mmol/L — AB (ref 98–110)
CO2: 27 mmol/L (ref 20–32)
Calcium: 9.8 mg/dL (ref 8.6–10.3)
Creat: 0.95 mg/dL (ref 0.70–1.25)
GFR, EST AFRICAN AMERICAN: 98 mL/min/{1.73_m2} (ref >=60)
GFR, Est Non African American: 85 mL/min/{1.73_m2} (ref >=60)
GLOBULIN: 2.6 g/dL (ref 1.9–3.7)
Glucose, Bld: 103 mg/dL — ABNORMAL HIGH (ref 65–99)
Potassium: 3.5 mmol/L (ref 3.5–5.3)
Sodium: 134 mmol/L — ABNORMAL LOW (ref 135–146)
TOTAL PROTEIN: 7.2 g/dL (ref 6.1–8.1)

## 2018-01-09 LAB — HEPATITIS A ANTIBODY, IGM: Hep A IgM: NONREACTIVE

## 2018-01-09 LAB — URINALYSIS, ROUTINE W REFLEX MICROSCOPIC
BILIRUBIN UA: NEGATIVE
GLUCOSE, UA: NEGATIVE
KETONES UA: NEGATIVE
LEUKOCYTES UA: NEGATIVE
NITRITE UA: NEGATIVE
Protein, UA: NEGATIVE
RBC UA: NEGATIVE
SPEC GRAV UA: 1.012 (ref 1.005–1.030)
Urobilinogen, Ur: 0.2 mg/dL (ref 0.2–1.0)
pH, UA: 6 (ref 5.0–7.5)

## 2018-01-09 LAB — HEPATITIS A ANTIBODY, TOTAL: Hepatitis A AB,Total: NONREACTIVE

## 2018-01-09 LAB — HEPATITIS C ANTIBODY
Hepatitis C Ab: NONREACTIVE
SIGNAL TO CUT-OFF: 0.01 (ref <1.00)

## 2018-01-09 LAB — HEPATITIS B SURFACE ANTIGEN: Hepatitis B Surface Ag: NONREACTIVE

## 2018-01-09 LAB — HEPATITIS B SURFACE ANTIBODY, QUANTITATIVE: Hepatitis B-Post: <5 m[IU]/mL — ABNORMAL LOW (ref >=10)

## 2018-01-09 NOTE — Telephone Encounter (Signed)
Copied from CRM [phone]. Topic: Appointment Scheduling - Scheduling Inquiry for Clinic >> Jan 08, 2018  4:52 PM Luanna Coleawoud, Jessica L wrote: Reason for CRM: pt calling to see if he needed to keep his np appointment. He has seen her 2 times already please advise [phone]#[phone]

## 2018-01-09 NOTE — Telephone Encounter (Signed)
He can push appt back from 3-4 months from last visit   TMS

## 2018-01-15 ENCOUNTER — Telehealth: Payer: Self-pay

## 2018-01-15 NOTE — Telephone Encounter (Signed)
Copied from CRM [phone]. Topic: General - Other >> Jan 15, 2018 10:35 AM Stephannie LiSimmons, Janett L, NT wrote: Reason for CRM: Patient called and said the location he has chosen for his MRI will [facility]   ,[address]  [phone] , he would like a morning appointment .please call him at [phone]

## 2018-01-16 NOTE — Telephone Encounter (Signed)
Called patient and informed him that per Dr. Shirlee LatchMclean it was ok to cancel appt for September since he has seen her prior to that new patient appt. Patient verbalized understanding of this and was ok with the cancelation

## 2018-01-19 ENCOUNTER — Encounter: Payer: Self-pay | Admitting: Internal Medicine

## 2018-01-23 ENCOUNTER — Other Ambulatory Visit: Payer: Self-pay | Admitting: Internal Medicine

## 2018-01-23 DIAGNOSIS — K769 Liver disease, unspecified: Secondary | ICD-10-CM

## 2018-01-23 NOTE — Telephone Encounter (Signed)
No order for MRI. Please order and I will arrange precert and appt. Thanks,Melissa

## 2018-02-05 ENCOUNTER — Telehealth: Payer: Self-pay | Admitting: Internal Medicine

## 2018-02-05 NOTE — Telephone Encounter (Signed)
MRI results

## 2018-02-05 NOTE — Telephone Encounter (Signed)
mychart message has been sent to patient to inform him that we did receive the results but have not been interpreted by a provider.

## 2018-02-05 NOTE — Telephone Encounter (Unsigned)
Copied from CRM [id number]. Topic: Quick Communication - See Telephone Encounter >> Feb 05, 2018  2:59 PM Floria RavelingStovall, Shana A wrote: CRM for notification. See Telephone encounter for: 02/05/18. Pt called in and wanted to see if Dr Gaynell Face[facility]lean [facility]  He had the MRI done last week   Best number [phone]

## 2018-02-08 ENCOUNTER — Encounter: Payer: Self-pay | Admitting: Internal Medicine

## 2018-02-14 ENCOUNTER — Telehealth: Payer: Self-pay | Admitting: Internal Medicine

## 2018-02-14 NOTE — Telephone Encounter (Signed)
mychart message has been sent to patient

## 2018-02-14 NOTE — Telephone Encounter (Signed)
MRI abdomen 02/01/18  3-4 benign liver cysts  TMS

## 2018-03-08 ENCOUNTER — Ambulatory Visit: Payer: Self-pay | Admitting: Internal Medicine

## 2018-04-26 ENCOUNTER — Other Ambulatory Visit: Payer: Self-pay | Admitting: Internal Medicine

## 2018-04-26 DIAGNOSIS — R609 Edema, unspecified: Secondary | ICD-10-CM

## 2018-04-26 DIAGNOSIS — I10 Essential (primary) hypertension: Secondary | ICD-10-CM

## 2018-04-26 MED ORDER — FUROSEMIDE 20 MG PO TABS: 20.0000 mg | ORAL_TABLET | Freq: Every day | ORAL | 1 refills | Status: DC

## 2018-04-26 MED ORDER — SPIRONOLACTONE 25 MG PO TABS: 25.0000 mg | ORAL_TABLET | Freq: Every day | ORAL | 1 refills | Status: DC

## 2018-04-26 NOTE — Telephone Encounter (Signed)
Refilled #90 RF x 1

## 2018-04-26 NOTE — Telephone Encounter (Signed)
Last OV 01/08/2018   Last refilled 01/08/2018 disp 90 with 1  Refill   Sent to PCP for approval

## 2018-05-11 ENCOUNTER — Ambulatory Visit: Payer: Managed Care, Other (non HMO) | Admitting: Internal Medicine

## 2019-01-17 ENCOUNTER — Other Ambulatory Visit: Payer: Self-pay | Admitting: Internal Medicine

## 2019-01-17 DIAGNOSIS — I10 Essential (primary) hypertension: Secondary | ICD-10-CM

## 2019-01-17 DIAGNOSIS — R609 Edema, unspecified: Secondary | ICD-10-CM

## 2019-01-17 MED ORDER — FUROSEMIDE 20 MG PO TABS: 20.0000 mg | ORAL_TABLET | Freq: Every day | ORAL | 0 refills | Status: DC

## 2019-01-17 MED ORDER — SPIRONOLACTONE 25 MG PO TABS: 25.0000 mg | ORAL_TABLET | Freq: Every day | ORAL | 0 refills | Status: DC

## 2019-04-09 ENCOUNTER — Other Ambulatory Visit: Payer: Self-pay | Admitting: Orthopedic Surgery

## 2019-04-09 DIAGNOSIS — M4807 Spinal stenosis, lumbosacral region: Secondary | ICD-10-CM

## 2019-04-09 DIAGNOSIS — M25552 Pain in left hip: Secondary | ICD-10-CM

## 2019-04-12 ENCOUNTER — Ambulatory Visit: Payer: Managed Care, Other (non HMO)

## 2019-04-12 ENCOUNTER — Other Ambulatory Visit: Payer: Self-pay | Admitting: Internal Medicine

## 2019-04-12 DIAGNOSIS — I10 Essential (primary) hypertension: Secondary | ICD-10-CM

## 2019-04-12 DIAGNOSIS — R609 Edema, unspecified: Secondary | ICD-10-CM

## 2019-04-12 MED ORDER — FUROSEMIDE 20 MG PO TABS: 20.0000 mg | ORAL_TABLET | Freq: Every day | ORAL | 0 refills | Status: DC

## 2019-04-12 MED ORDER — SPIRONOLACTONE 25 MG PO TABS: 25.0000 mg | ORAL_TABLET | Freq: Every day | ORAL | 0 refills | Status: DC

## 2019-04-17 ENCOUNTER — Ambulatory Visit
Admission: RE | Admit: 2019-04-17 | Discharge: 2019-04-17 | Disposition: A | Discharge status: Home / Self Care | Payer: Managed Care, Other (non HMO) | Source: Ambulatory Visit | Attending: Orthopedic Surgery | Admitting: Orthopedic Surgery

## 2019-04-17 ENCOUNTER — Other Ambulatory Visit: Payer: Self-pay

## 2019-04-17 DIAGNOSIS — M25552 Pain in left hip: Secondary | ICD-10-CM | POA: Insufficient documentation

## 2019-04-17 DIAGNOSIS — M4807 Spinal stenosis, lumbosacral region: Secondary | ICD-10-CM | POA: Diagnosis not present

## 2019-08-04 ENCOUNTER — Ambulatory Visit: Payer: Managed Care, Other (non HMO) | Attending: Internal Medicine

## 2019-08-04 DIAGNOSIS — Z23 Encounter for immunization: Secondary | ICD-10-CM | POA: Insufficient documentation

## 2019-08-04 NOTE — Progress Notes (Signed)
   Covid-19 Vaccination Clinic  Name:  Vincent Hayes    MRN: [id number] DOB: [birth year]  08/04/2019  [name] was observed post Covid-19 immunization for 15 minutes without incidence. He was provided with Vaccine Information Sheet and instruction to access the V-Safe system.   [name] was instructed to call 911 with any severe reactions post vaccine: Marland Kitchen Difficulty breathing  . Swelling of your face and throat  . A fast heartbeat  . A bad rash all over your body  . Dizziness and weakness    Immunizations Administered    Name Date Dose VIS Date Route   Pfizer COVID-19 Vaccine 08/04/2019 10:16 AM 0.3 mL 05/31/2019 Intramuscular   Manufacturer: [employer], [employer]   Lot: [id number]   NDC: [address]-7

## 2019-08-21 ENCOUNTER — Telehealth: Payer: Self-pay | Admitting: Internal Medicine

## 2019-08-21 DIAGNOSIS — R609 Edema, unspecified: Secondary | ICD-10-CM

## 2019-08-21 DIAGNOSIS — I10 Essential (primary) hypertension: Secondary | ICD-10-CM

## 2019-08-21 NOTE — Telephone Encounter (Signed)
Pt wants refills on spironolactone (ALDACTONE) 25 MG tablet and furosemide (LASIX) 20 MG tablet. Pt's insurance needs 90 day supply. Pt has appt on 10/03/19

## 2019-08-21 NOTE — Telephone Encounter (Signed)
Note on medication say no refill until appointment. Pt insurance wants 90 days. Please advise.

## 2019-08-23 NOTE — Telephone Encounter (Signed)
Left message for patient to return call back. Needs to know refill was refused and he needs labs complketed in office per Dr Darrick Huntsman.

## 2019-08-23 NOTE — Telephone Encounter (Signed)
Pt called and was given information in message below he said that he has about a week of medication left.

## 2019-08-23 NOTE — Telephone Encounter (Signed)
Refill is denied because he has not had labs since 2019.  Has he been told this?

## 2019-08-30 NOTE — Telephone Encounter (Signed)
Pt called back to check on the status of refills No lab orders in per last note

## 2019-09-03 ENCOUNTER — Ambulatory Visit: Payer: Managed Care, Other (non HMO) | Attending: Internal Medicine

## 2019-09-03 DIAGNOSIS — Z23 Encounter for immunization: Secondary | ICD-10-CM

## 2019-09-03 NOTE — Progress Notes (Signed)
   Covid-19 Vaccination Clinic  Name:  Macedonio Scallon    MRN: [id number] DOB: [birth year]  09/03/2019  Mr. Dunshee was observed post Covid-19 immunization for 15 minutes without incident. He was provided with Vaccine Information Sheet and instruction to access the V-Safe system.   Mr. Messer was instructed to call 911 with any severe reactions post vaccine: Marland Kitchen Difficulty breathing  . Swelling of face and throat  . A fast heartbeat  . A bad rash all over body  . Dizziness and weakness   Immunizations Administered    Name Date Dose VIS Date Route   Pfizer COVID-19 Vaccine 09/03/2019  9:16 AM 0.3 mL 05/31/2019 Intramuscular   Manufacturer: [employer], [employer]   Lot: [id number]   NDC: [address]-3

## 2019-09-04 ENCOUNTER — Other Ambulatory Visit: Payer: Self-pay | Admitting: Internal Medicine

## 2019-09-04 DIAGNOSIS — Z1329 Encounter for screening for other suspected endocrine disorder: Secondary | ICD-10-CM

## 2019-09-04 DIAGNOSIS — E559 Vitamin D deficiency, unspecified: Secondary | ICD-10-CM

## 2019-09-04 DIAGNOSIS — I10 Essential (primary) hypertension: Secondary | ICD-10-CM

## 2019-09-04 DIAGNOSIS — R609 Edema, unspecified: Secondary | ICD-10-CM

## 2019-09-04 DIAGNOSIS — Z125 Encounter for screening for malignant neoplasm of prostate: Secondary | ICD-10-CM

## 2019-09-04 DIAGNOSIS — Z1389 Encounter for screening for other disorder: Secondary | ICD-10-CM

## 2019-09-04 DIAGNOSIS — M109 Gout, unspecified: Secondary | ICD-10-CM

## 2019-09-04 MED ORDER — FUROSEMIDE 20 MG PO TABS: 20.0000 mg | ORAL_TABLET | Freq: Every day | ORAL | 0 refills | Status: DC

## 2019-09-04 MED ORDER — SPIRONOLACTONE 25 MG PO TABS: 25.0000 mg | ORAL_TABLET | Freq: Every day | ORAL | 0 refills | Status: DC

## 2019-09-04 NOTE — Telephone Encounter (Signed)
Please advise ,  No labs ordered can patient come in for labs soon in order to fill medication?

## 2019-09-04 NOTE — Telephone Encounter (Signed)
Patient informed and verbalized understanding.  Scheduled to come in form labs 09/23/19 at 8:45 am. Confirmed 10/04/19 appointment at 3:00 pm with Dr French Ana.

## 2019-09-04 NOTE — Telephone Encounter (Signed)
Labs in can be sch fasting prior to next appt now to 1-2 weeks before next appt 10/03/19  Refilled meds x 1 month  In future needs to be seen 1x per year for refills of meds   TMS

## 2019-09-04 NOTE — Telephone Encounter (Signed)
Pt called back about refills. He has an appt in April. No sooner appts available. No orders in for labs. Pt does not want to be without meds.Pt has inquired about this a couple of times. Please advise.

## 2019-09-23 ENCOUNTER — Other Ambulatory Visit (INDEPENDENT_AMBULATORY_CARE_PROVIDER_SITE_OTHER): Payer: Managed Care, Other (non HMO)

## 2019-09-23 ENCOUNTER — Other Ambulatory Visit: Payer: Self-pay

## 2019-09-23 DIAGNOSIS — Z1329 Encounter for screening for other suspected endocrine disorder: Secondary | ICD-10-CM | POA: Diagnosis not present

## 2019-09-23 DIAGNOSIS — M109 Gout, unspecified: Secondary | ICD-10-CM | POA: Diagnosis not present

## 2019-09-23 DIAGNOSIS — I10 Essential (primary) hypertension: Secondary | ICD-10-CM

## 2019-09-23 DIAGNOSIS — Z125 Encounter for screening for malignant neoplasm of prostate: Secondary | ICD-10-CM | POA: Diagnosis not present

## 2019-09-23 DIAGNOSIS — Z1389 Encounter for screening for other disorder: Secondary | ICD-10-CM

## 2019-09-23 DIAGNOSIS — E559 Vitamin D deficiency, unspecified: Secondary | ICD-10-CM

## 2019-09-23 LAB — COMPREHENSIVE METABOLIC PANEL
ALT: 16 U/L (ref 0–53)
AST: 19 U/L (ref 0–37)
Albumin: 4.9 g/dL (ref 3.5–5.2)
Alkaline Phosphatase: 64 U/L (ref 39–117)
BUN: 17 mg/dL (ref 6–23)
CO2: 29 mEq/L (ref 19–32)
Calcium: 10.2 mg/dL (ref 8.4–10.5)
Chloride: 102 mEq/L (ref 96–112)
Creatinine, Ser: 1.1 mg/dL (ref 0.40–1.50)
GFR: 67.04 mL/min (ref >60.00)
Glucose, Bld: 106 mg/dL — ABNORMAL HIGH (ref 70–99)
Potassium: 4.2 mEq/L (ref 3.5–5.1)
Sodium: 140 mEq/L (ref 135–145)
Total Bilirubin: 0.4 mg/dL (ref 0.2–1.2)
Total Protein: 7.6 g/dL (ref 6.0–8.3)

## 2019-09-23 LAB — CBC WITH DIFFERENTIAL/PLATELET
Basophils Absolute: 0.1 10*3/uL (ref 0.0–0.1)
Basophils Relative: 0.8 % (ref 0.0–3.0)
Eosinophils Absolute: 0.4 10*3/uL (ref 0.0–0.7)
Eosinophils Relative: 4.1 % (ref 0.0–5.0)
HCT: 44 % (ref 39.0–52.0)
Hemoglobin: 15.1 g/dL (ref 13.0–17.0)
Lymphocytes Relative: 22.3 % (ref 12.0–46.0)
Lymphs Abs: 1.9 10*3/uL (ref 0.7–4.0)
MCHC: 34.4 g/dL (ref 30.0–36.0)
MCV: 98 fl (ref 78.0–100.0)
Monocytes Absolute: 0.8 10*3/uL (ref 0.1–1.0)
Monocytes Relative: 9.5 % (ref 3.0–12.0)
Neutro Abs: 5.5 10*3/uL (ref 1.4–7.7)
Neutrophils Relative %: 63.3 % (ref 43.0–77.0)
Platelets: 203 10*3/uL (ref 150.0–400.0)
RBC: 4.49 Mil/uL (ref 4.22–5.81)
RDW: 13.5 % (ref 11.5–15.5)
WBC: 8.6 10*3/uL (ref 4.0–10.5)

## 2019-09-23 LAB — VITAMIN D 25 HYDROXY (VIT D DEFICIENCY, FRACTURES): VITD: 32.02 ng/mL (ref 30.00–100.00)

## 2019-09-23 LAB — URINALYSIS, ROUTINE W REFLEX MICROSCOPIC
Bacteria, UA: NONE SEEN /HPF
Bilirubin Urine: NEGATIVE
Glucose, UA: NEGATIVE
Hgb urine dipstick: NEGATIVE
Hyaline Cast: NONE SEEN /LPF
Nitrite: NEGATIVE
Protein, ur: NEGATIVE
Specific Gravity, Urine: 1.025 (ref 1.001–1.03)
Squamous Epithelial / HPF: NONE SEEN /HPF (ref <=5)
WBC, UA: NONE SEEN /HPF (ref 0–5)
pH: 6.5 (ref 5.0–8.0)

## 2019-09-23 LAB — TSH: TSH: 1.98 u[IU]/mL (ref 0.35–4.50)

## 2019-09-23 LAB — LIPID PANEL
Cholesterol: 235 mg/dL — ABNORMAL HIGH (ref 0–200)
HDL: 48.7 mg/dL (ref >39.00)
LDL Cholesterol: 167 mg/dL — ABNORMAL HIGH (ref 0–99)
NonHDL: 185.99
Total CHOL/HDL Ratio: 5
Triglycerides: 97 mg/dL (ref 0.0–149.0)
VLDL: 19.4 mg/dL (ref 0.0–40.0)

## 2019-09-23 LAB — PSA: PSA: 1.12 ng/mL (ref 0.10–4.00)

## 2019-09-23 LAB — URIC ACID: Uric Acid, Serum: 9.1 mg/dL — ABNORMAL HIGH (ref 4.0–7.8)

## 2019-10-01 ENCOUNTER — Other Ambulatory Visit: Payer: Self-pay | Admitting: Internal Medicine

## 2019-10-01 DIAGNOSIS — R609 Edema, unspecified: Secondary | ICD-10-CM

## 2019-10-01 DIAGNOSIS — I10 Essential (primary) hypertension: Secondary | ICD-10-CM

## 2019-10-01 MED ORDER — SPIRONOLACTONE 25 MG PO TABS: 25.0000 mg | ORAL_TABLET | Freq: Every day | ORAL | 0 refills | Status: DC

## 2019-10-01 MED ORDER — FUROSEMIDE 20 MG PO TABS: 20.0000 mg | ORAL_TABLET | Freq: Every day | ORAL | 0 refills | Status: DC

## 2019-10-02 ENCOUNTER — Other Ambulatory Visit: Payer: Self-pay

## 2019-10-03 ENCOUNTER — Encounter: Payer: Managed Care, Other (non HMO) | Admitting: Internal Medicine

## 2019-10-04 ENCOUNTER — Encounter: Payer: Self-pay | Admitting: Internal Medicine

## 2019-10-04 ENCOUNTER — Ambulatory Visit (INDEPENDENT_AMBULATORY_CARE_PROVIDER_SITE_OTHER): Payer: Managed Care, Other (non HMO) | Admitting: Internal Medicine

## 2019-10-04 ENCOUNTER — Other Ambulatory Visit: Payer: Self-pay

## 2019-10-04 VITALS — BP 136/80 | HR 95 | Temp 98.2°F | Ht 72.0 in | Wt 197.6 lb

## 2019-10-04 DIAGNOSIS — R609 Edema, unspecified: Secondary | ICD-10-CM

## 2019-10-04 DIAGNOSIS — Z720 Tobacco use: Secondary | ICD-10-CM

## 2019-10-04 DIAGNOSIS — M5126 Other intervertebral disc displacement, lumbar region: Secondary | ICD-10-CM

## 2019-10-04 DIAGNOSIS — I10 Essential (primary) hypertension: Secondary | ICD-10-CM

## 2019-10-04 DIAGNOSIS — H269 Unspecified cataract: Secondary | ICD-10-CM | POA: Diagnosis not present

## 2019-10-04 DIAGNOSIS — Z0000 Encounter for general adult medical examination without abnormal findings: Secondary | ICD-10-CM | POA: Diagnosis not present

## 2019-10-04 DIAGNOSIS — G8929 Other chronic pain: Secondary | ICD-10-CM

## 2019-10-04 DIAGNOSIS — Z1283 Encounter for screening for malignant neoplasm of skin: Secondary | ICD-10-CM

## 2019-10-04 DIAGNOSIS — M79675 Pain in left toe(s): Secondary | ICD-10-CM

## 2019-10-04 DIAGNOSIS — E790 Hyperuricemia without signs of inflammatory arthritis and tophaceous disease: Secondary | ICD-10-CM

## 2019-10-04 DIAGNOSIS — M25572 Pain in left ankle and joints of left foot: Secondary | ICD-10-CM

## 2019-10-04 DIAGNOSIS — E785 Hyperlipidemia, unspecified: Secondary | ICD-10-CM

## 2019-10-04 DIAGNOSIS — L989 Disorder of the skin and subcutaneous tissue, unspecified: Secondary | ICD-10-CM

## 2019-10-04 DIAGNOSIS — K760 Fatty (change of) liver, not elsewhere classified: Secondary | ICD-10-CM

## 2019-10-04 MED ORDER — SPIRONOLACTONE 25 MG PO TABS: 25.0000 mg | ORAL_TABLET | Freq: Every day | ORAL | 3 refills | Status: DC

## 2019-10-04 MED ORDER — SPIRONOLACTONE 50 MG PO TABS: 50.0000 mg | ORAL_TABLET | Freq: Every day | ORAL | 3 refills | Status: DC

## 2019-10-04 MED ORDER — FUROSEMIDE 20 MG PO TABS: 20.0000 mg | ORAL_TABLET | Freq: Every day | ORAL | 3 refills | Status: DC

## 2019-10-04 NOTE — Progress Notes (Signed)
Chief Complaint  Patient presents with  . Annual Exam    Has a ruptured disc between L4-L5 and currently sees Dr Rosita Kea for epidurals.   . Gout    more frequent flare ups over the last couple of months   Annual  1. Ruptured disc L4/5 12/2018 saw chiropractor x 2 times but had epidural injection 05/2019 which helped and doing exercises per pt 2x per day  This limited exercise and was walking 2 miles per day now 1-1.5 miles per day he is also doing 1 hr or cardio   2. C/o left>right ankle pain and left great toe pain and uric acid elevated 9.1 c/w gout and doesn't know if wants allopurinol for now  Standing working from home makes sx's worse mild to moderate pain at times declines Xray left foot and ankle today   3. Right cheek lesion changing shape and wants to see dermatology  4. H/o cataracts needs to f/u with Dr. Lew Dawes  5. H/o alcohol abuse stopped drinking etoh and feeling better  6. Tobacco abuse smoking 15 cig per day and wants to quit cxr 2019 normal and declines CT chest for now will think about this    Review of Systems  Constitutional: Negative for weight loss.  HENT: Negative for hearing loss.   Eyes: Negative for blurred vision.  Respiratory: Negative for shortness of breath.   Cardiovascular: Positive for leg swelling. Negative for chest pain.  Gastrointestinal: Negative for abdominal pain and blood in stool.  Musculoskeletal: Positive for back pain and joint pain.  Skin: Negative for rash.  Neurological: Negative for headaches.  Psychiatric/Behavioral: Negative for depression.   Past Medical History:  Diagnosis Date  . Allergy   . Cataract    mild Dr. Lew Dawes   . Elevated liver enzymes   . Fatty liver   . Gout   . History of chicken pox   . Hypertension    Past Surgical History:  Procedure Laterality Date  . APPENDECTOMY     1960  . KNEE SURGERY  1985   arthroscopy    Family History  Adopted: Yes   Social History   Socioeconomic History  . Marital  status: Legally Separated    Spouse name: Not on file  . Number of children: Not on file  . Years of education: Not on file  . Highest education level: Not on file  Occupational History  . Not on file  Tobacco Use  . Smoking status: Current Every Day Smoker    Packs/day: 0.50    Types: Cigarettes  . Smokeless tobacco: Never Used  Substance and Sexual Activity  . Alcohol use: Yes    Comment: 1 bottle wine 3/7 nights a week   . Drug use: Not Currently  . Sexual activity: Yes    Partners: Female  Other Topics Concern  . Not on file  Social History Narrative   Separated    2 kids son and daughter    Event organiser    Owns guns, wears seat belt, safe in relationship    Social Determinants of Health   Financial Resource Strain:   . Difficulty of Paying Living Expenses:   Food Insecurity:   . Worried About Programme researcher, broadcasting/film/video in the Last Year:   . Barista in the Last Year:   Transportation Needs:   . Freight forwarder (Medical):   Marland Kitchen Lack of Transportation (Non-Medical):   Physical Activity:   .  Days of Exercise per Week:   . Minutes of Exercise per Session:   Stress:   . Feeling of Stress :   Social Connections:   . Frequency of Communication with Friends and Family:   . Frequency of Social Gatherings with Friends and Family:   . Attends Religious Services:   . Active Member of Clubs or Organizations:   . Attends BankerClub or Organization Meetings:   Marland Kitchen. Marital Status:   Intimate Partner Violence:   . Fear of Current or Ex-Partner:   . Emotionally Abused:   Marland Kitchen. Physically Abused:   . Sexually Abused:    Current Meds  Medication Sig  . Cholecalciferol (VITAMIN D3 PO) Take by mouth daily.  . furosemide (LASIX) 20 MG tablet Take 1 tablet (20 mg total) by mouth daily. In am in am appt further refills call office  . Multiple Vitamins-Minerals (MULTIVITAMIN ADULT PO) Take by mouth.  . Omega-3 Fatty Acids (OMEGA-3 FISH OIL PO) Take by mouth daily.   Marland Kitchen. spironolactone (ALDACTONE) 50 MG tablet Take 1 tablet (50 mg total) by mouth daily. In am appt further refills call the office  . TURMERIC PO Take by mouth daily.  . [DISCONTINUED] furosemide (LASIX) 20 MG tablet Take 1 tablet (20 mg total) by mouth daily. In am in am appt further refills call office  . [DISCONTINUED] NON FORMULARY Tumeric qd  . [DISCONTINUED] spironolactone (ALDACTONE) 25 MG tablet Take 1 tablet (25 mg total) by mouth daily. In am appt further refills call the office  . [DISCONTINUED] spironolactone (ALDACTONE) 25 MG tablet Take 1 tablet (25 mg total) by mouth daily. In am appt further refills call the office   Allergies  Allergen Reactions  . Penicillin G Other (See Comments)    As kid    Recent Results (from the past 2160 hour(s))  Uric acid     Status: Abnormal   Collection Time: 09/23/19  8:32 AM  Result Value Ref Range   Uric Acid, Serum 9.1 (H) 4.0 - 7.8 mg/dL  PSA     Status: None   Collection Time: 09/23/19  8:32 AM  Result Value Ref Range   PSA 1.12 0.10 - 4.00 ng/mL    Comment: Test performed using Access Hybritech PSA Assay, a parmagnetic partical, chemiluminecent immunoassay.  Vitamin D (25 hydroxy)     Status: None   Collection Time: 09/23/19  8:32 AM  Result Value Ref Range   VITD 32.02 30.00 - 100.00 ng/mL  TSH     Status: None   Collection Time: 09/23/19  8:32 AM  Result Value Ref Range   TSH 1.98 0.35 - 4.50 uIU/mL  Lipid panel     Status: Abnormal   Collection Time: 09/23/19  8:32 AM  Result Value Ref Range   Cholesterol 235 (H) 0 - 200 mg/dL    Comment: ATP III Classification       Desirable:  < 200 mg/dL               Borderline High:  200 - 239 mg/dL          High:  > = 409240 mg/dL   Triglycerides 81.197.0 0.0 - 149.0 mg/dL    Comment: Normal:  <914<150 mg/dLBorderline High:  150 - 199 mg/dL   HDL 78.2948.70 >56.21>39.00 mg/dL   VLDL 30.819.4 0.0 - 65.740.0 mg/dL   LDL Cholesterol 846167 (H) 0 - 99 mg/dL   Total CHOL/HDL Ratio 5     Comment:  Men           Women1/2 Average Risk     3.4          3.3Average Risk          5.0          4.42X Average Risk          9.6          7.13X Average Risk          15.0          11.0                       NonHDL 185.99     Comment: NOTE:  Non-HDL goal should be 30 mg/dL higher than patient's LDL goal (i.e. LDL goal of < 70 mg/dL, would have non-HDL goal of < 100 mg/dL)  CBC with Differential/Platelet     Status: None   Collection Time: 09/23/19  8:32 AM  Result Value Ref Range   WBC 8.6 4.0 - 10.5 K/uL   RBC 4.49 4.22 - 5.81 Mil/uL   Hemoglobin 15.1 13.0 - 17.0 g/dL   HCT 44.0 39.0 - 52.0 %   MCV 98.0 78.0 - 100.0 fl   MCHC 34.4 30.0 - 36.0 g/dL   RDW 13.5 11.5 - 15.5 %   Platelets 203.0 150.0 - 400.0 K/uL   Neutrophils Relative % 63.3 43.0 - 77.0 %   Lymphocytes Relative 22.3 12.0 - 46.0 %   Monocytes Relative 9.5 3.0 - 12.0 %   Eosinophils Relative 4.1 0.0 - 5.0 %   Basophils Relative 0.8 0.0 - 3.0 %   Neutro Abs 5.5 1.4 - 7.7 K/uL   Lymphs Abs 1.9 0.7 - 4.0 K/uL   Monocytes Absolute 0.8 0.1 - 1.0 K/uL   Eosinophils Absolute 0.4 0.0 - 0.7 K/uL   Basophils Absolute 0.1 0.0 - 0.1 K/uL  Comprehensive metabolic panel     Status: Abnormal   Collection Time: 09/23/19  8:32 AM  Result Value Ref Range   Sodium 140 135 - 145 mEq/L   Potassium 4.2 3.5 - 5.1 mEq/L   Chloride 102 96 - 112 mEq/L   CO2 29 19 - 32 mEq/L   Glucose, Bld 106 (H) 70 - 99 mg/dL   BUN 17 6 - 23 mg/dL   Creatinine, Ser 1.10 0.40 - 1.50 mg/dL   Total Bilirubin 0.4 0.2 - 1.2 mg/dL   Alkaline Phosphatase 64 39 - 117 U/L   AST 19 0 - 37 U/L   ALT 16 0 - 53 U/L   Total Protein 7.6 6.0 - 8.3 g/dL   Albumin 4.9 3.5 - 5.2 g/dL   GFR 67.04 >60.00 mL/min   Calcium 10.2 8.4 - 10.5 mg/dL  Urinalysis, Routine w reflex microscopic     Status: Abnormal   Collection Time: 09/23/19  8:33 AM  Result Value Ref Range   Color, Urine YELLOW YELLOW   APPearance CLEAR CLEAR   Specific Gravity, Urine 1.025 1.001 - 1.03   pH 6.5 5.0 - 8.0    Glucose, UA NEGATIVE NEGATIVE   Bilirubin Urine NEGATIVE NEGATIVE   Ketones, ur TRACE (A) NEGATIVE   Hgb urine dipstick NEGATIVE NEGATIVE   Protein, ur NEGATIVE NEGATIVE   Nitrite NEGATIVE NEGATIVE   Leukocytes,Ua TRACE (A) NEGATIVE   WBC, UA NONE SEEN 0 - 5 /HPF   RBC / HPF 0-2 0 - 2 /HPF   Squamous Epithelial / LPF NONE SEEN <  OR = 5 /HPF   Bacteria, UA NONE SEEN NONE SEEN /HPF   Hyaline Cast NONE SEEN NONE SEEN /LPF   Objective  Body mass index is 26.8 kg/m. Wt Readings from Last 3 Encounters:  10/04/19 197 lb 9.6 oz (89.6 kg)  01/08/18 164 lb 3.2 oz (74.5 kg)  12/25/17 169 lb (76.7 kg)   Temp Readings from Last 3 Encounters:  10/04/19 98.2 F (36.8 C) (Temporal)  01/08/18 98.1 F (36.7 C) (Oral)  12/25/17 98.6 F (37 C) (Oral)   BP Readings from Last 3 Encounters:  10/04/19 136/80  01/08/18 134/88  12/25/17 (!) 164/98   Pulse Readings from Last 3 Encounters:  10/04/19 95  01/08/18 89  12/25/17 (!) 104    Physical Exam Vitals and nursing note reviewed.  Constitutional:      Appearance: Normal appearance. He is well-developed and well-groomed.  HENT:     Head: Normocephalic and atraumatic.  Eyes:     Conjunctiva/sclera: Conjunctivae normal.     Pupils: Pupils are equal, round, and reactive to light.  Cardiovascular:     Rate and Rhythm: Normal rate and regular rhythm.     Heart sounds: Normal heart sounds. No murmur.     Comments: 1-2 +leg edema b/l  Pulmonary:     Effort: Pulmonary effort is normal.     Breath sounds: Normal breath sounds.  Skin:    General: Skin is warm and dry.  Neurological:     General: No focal deficit present.     Mental Status: He is alert and oriented to person, place, and time. Mental status is at baseline.     Gait: Gait normal.  Psychiatric:        Attention and Perception: Attention and perception normal.        Mood and Affect: Mood and affect normal.        Speech: Speech normal.        Behavior: Behavior normal.  Behavior is cooperative.        Thought Content: Thought content normal.        Cognition and Memory: Cognition and memory normal.        Judgment: Judgment normal.     Assessment  Plan  Annual physical exam utd 07/08/2018  covid 2/2  Will disc shingrix and prevnar, pna 23 vaccine, Tdap  in future  colonoscopy declines for now consider cologuard disc today   Declines HIV testing check hep A/B/C prev.  -consider hep A/B/C in future with h/o fatty liver   Smoker 1/2 ppd since childhood 64 y.o quit x 10 years 2005 to 2015 no FH known adopted rec reduce etohcongratulated on etoh x 5 days  Consider CT chest screening lung cancer declines for now   Nl psa and DRE declines today   Essential hypertension - Plan: furosemide (LASIX) 20 MG tablet, ECHOCARDIOGRAM COMPLETE, spironolactone (ALDACTONE) 50 MG tablet, DISCONTINUED: spironolactone (ALDACTONE) 25 MG tablet  Edema, unspecified type - Plan: furosemide (LASIX) 20 MG tablet, ECHOCARDIOGRAM COMPLETE, spironolactone (ALDACTONE) 50 MG tablet lasix 20 mg  Consider increase lasix to 40 mg pt to let me know if this is needed   Cataract of both eyes, unspecified cataract type - Plan: Ambulatory referral to Ophthalmology Dr. Lew Dawes  Skin lesion - Plan: Ambulatory referral to Dermatology   Skin cancer screening - Plan: Ambulatory referral to Dermatology  Lumbar herniated disc S/p epidural injection 05/2019   Elevated blood uric acid level Great toe pain, left Chronic pain of left  ankle -consider allopurinol in future declines for now  Consider Xray left left ankle and foot   Tobacco abuse  rec cessation consider patches/gum   Provider: Dr. French Ana McLean-Scocuzza-Internal Medicine

## 2019-10-04 NOTE — Patient Instructions (Addendum)
Vitamin D3 2000 daily nature made good brand  Goal blood pressure <130/<80  cologuard consider cpt code 16109 cost of cologuard   We will do echo of your heart  Refer dermatology and eye MD Dr. Lew Dawes    Low Back Sprain or Strain Rehab Ask your health care provider which exercises are safe for you. Do exercises exactly as told by your health care provider and adjust them as directed. It is normal to feel mild stretching, pulling, tightness, or discomfort as you do these exercises. Stop right away if you feel sudden pain or your pain gets worse. Do not begin these exercises until told by your health care provider. Stretching and range-of-motion exercises These exercises warm up your muscles and joints and improve the movement and flexibility of your back. These exercises also help to relieve pain, numbness, and tingling. Lumbar rotation  1. Lie on your back on a firm surface and bend your knees. 2. Straighten your arms out to your sides so each arm forms a 90-degree angle (right angle) with a side of your body. 3. Slowly move (rotate) both of your knees to one side of your body until you feel a stretch in your lower back (lumbar). Try not to let your shoulders lift off the floor. 4. Hold this position for __________ seconds. 5. Tense your abdominal muscles and slowly move your knees back to the starting position. 6. Repeat this exercise on the other side of your body. Repeat __________ times. Complete this exercise __________ times a day. Single knee to chest  1. Lie on your back on a firm surface with both legs straight. 2. Bend one of your knees. Use your hands to move your knee up toward your chest until you feel a gentle stretch in your lower back and buttock. ? Hold your leg in this position by holding on to the front of your knee. ? Keep your other leg as straight as possible. 3. Hold this position for __________ seconds. 4. Slowly return to the starting position. 5. Repeat with  your other leg. Repeat __________ times. Complete this exercise __________ times a day. Prone extension on elbows  1. Lie on your abdomen on a firm surface (prone position). 2. Prop yourself up on your elbows. 3. Use your arms to help lift your chest up until you feel a gentle stretch in your abdomen and your lower back. ? This will place some of your body weight on your elbows. If this is uncomfortable, try stacking pillows under your chest. ? Your hips should stay down, against the surface that you are lying on. Keep your hip and back muscles relaxed. 4. Hold this position for __________ seconds. 5. Slowly relax your upper body and return to the starting position. Repeat __________ times. Complete this exercise __________ times a day. Strengthening exercises These exercises build strength and endurance in your back. Endurance is the ability to use your muscles for a long time, even after they get tired. Pelvic tilt This exercise strengthens the muscles that lie deep in the abdomen. 1. Lie on your back on a firm surface. Bend your knees and keep your feet flat on the floor. 2. Tense your abdominal muscles. Tip your pelvis up toward the ceiling and flatten your lower back into the floor. ? To help with this exercise, you may place a small towel under your lower back and try to push your back into the towel. 3. Hold this position for __________ seconds. 4. Let your muscles  relax completely before you repeat this exercise. Repeat __________ times. Complete this exercise __________ times a day. Alternating arm and leg raises  1. Get on your hands and knees on a firm surface. If you are on a hard floor, you may want to use padding, such as an exercise mat, to cushion your knees. 2. Line up your arms and legs. Your hands should be directly below your shoulders, and your knees should be directly below your hips. 3. Lift your left leg behind you. At the same time, raise your right arm and  straighten it in front of you. ? Do not lift your leg higher than your hip. ? Do not lift your arm higher than your shoulder. ? Keep your abdominal and back muscles tight. ? Keep your hips facing the ground. ? Do not arch your back. ? Keep your balance carefully, and do not hold your breath. 4. Hold this position for __________ seconds. 5. Slowly return to the starting position. 6. Repeat with your right leg and your left arm. Repeat __________ times. Complete this exercise __________ times a day. Abdominal set with straight leg raise  1. Lie on your back on a firm surface. 2. Bend one of your knees and keep your other leg straight. 3. Tense your abdominal muscles and lift your straight leg up, 4-6 inches (10-15 cm) off the ground. 4. Keep your abdominal muscles tight and hold this position for __________ seconds. ? Do not hold your breath. ? Do not arch your back. Keep it flat against the ground. 5. Keep your abdominal muscles tense as you slowly lower your leg back to the starting position. 6. Repeat with your other leg. Repeat __________ times. Complete this exercise __________ times a day. Single leg lower with bent knees 1. Lie on your back on a firm surface. 2. Tense your abdominal muscles and lift your feet off the floor, one foot at a time, so your knees and hips are bent in 90-degree angles (right angles). ? Your knees should be over your hips and your lower legs should be parallel to the floor. 3. Keeping your abdominal muscles tense and your knee bent, slowly lower one of your legs so your toe touches the ground. 4. Lift your leg back up to return to the starting position. ? Do not hold your breath. ? Do not let your back arch. Keep your back flat against the ground. 5. Repeat with your other leg. Repeat __________ times. Complete this exercise __________ times a day. Posture and body mechanics Good posture and healthy body mechanics can help to relieve stress in your  body's tissues and joints. Body mechanics refers to the movements and positions of your body while you do your daily activities. Posture is part of body mechanics. Good posture means:  Your spine is in its natural S-curve position (neutral).  Your shoulders are pulled back slightly.  Your head is not tipped forward. Follow these guidelines to improve your posture and body mechanics in your everyday activities. Standing   When standing, keep your spine neutral and your feet about hip width apart. Keep a slight bend in your knees. Your ears, shoulders, and hips should line up.  When you do a task in which you stand in one place for a long time, place one foot up on a stable object that is 2-4 inches (5-10 cm) high, such as a footstool. This helps keep your spine neutral. Sitting   When sitting, keep your spine neutral and  keep your feet flat on the floor. Use a footrest, if necessary, and keep your thighs parallel to the floor. Avoid rounding your shoulders, and avoid tilting your head forward.  When working at a desk or a computer, keep your desk at a height where your hands are slightly lower than your elbows. Slide your chair under your desk so you are close enough to maintain good posture.  When working at a computer, place your monitor at a height where you are looking straight ahead and you do not have to tilt your head forward or downward to look at the screen. Resting  When lying down and resting, avoid positions that are most painful for you.  If you have pain with activities such as sitting, bending, stooping, or squatting, lie in a position in which your body does not bend very much. For example, avoid curling up on your side with your arms and knees near your chest (fetal position).  If you have pain with activities such as standing for a long time or reaching with your arms, lie with your spine in a neutral position and bend your knees slightly. Try the following  positions: ? Lying on your side with a pillow between your knees. ? Lying on your back with a pillow under your knees. Lifting   When lifting objects, keep your feet at least shoulder width apart and tighten your abdominal muscles.  Bend your knees and hips and keep your spine neutral. It is important to lift using the strength of your legs, not your back. Do not lock your knees straight out.  Always ask for help to lift heavy or awkward objects. This information is not intended to replace advice given to you by your health care provider. Make sure you discuss any questions you have with your health care provider. Document Revised: 09/28/2018 Document Reviewed: 06/28/2018 Elsevier Patient Education  2020 [employer]  Back Exercises The following exercises strengthen the muscles that help to support the trunk and back. They also help to keep the lower back flexible. Doing these exercises can help to prevent back pain or lessen existing pain.  If you have back pain or discomfort, try doing these exercises 2-3 times each day or as told by your health care provider.  As your pain improves, do them once each day, but increase the number of times that you repeat the steps for each exercise (do more repetitions).  To prevent the recurrence of back pain, continue to do these exercises once each day or as told by your health care provider. Do exercises exactly as told by your health care provider and adjust them as directed. It is normal to feel mild stretching, pulling, tightness, or discomfort as you do these exercises, but you should stop right away if you feel sudden pain or your pain gets worse. Exercises Single knee to chest Repeat these steps 3-5 times for each leg: 1. Lie on your back on a firm bed or the floor with your legs extended. 2. Bring one knee to your chest. Your other leg should stay extended and in contact with the floor. 3. Hold your knee in place by grabbing your knee or  thigh with both hands and hold. 4. Pull on your knee until you feel a gentle stretch in your lower back or buttocks. 5. Hold the stretch for 10-30 seconds. 6. Slowly release and straighten your leg. Pelvic tilt Repeat these steps 5-10 times: 1. Lie on your back on a firm  bed or the floor with your legs extended. 2. Bend your knees so they are pointing toward the ceiling and your feet are flat on the floor. 3. Tighten your lower abdominal muscles to press your lower back against the floor. This motion will tilt your pelvis so your tailbone points up toward the ceiling instead of pointing to your feet or the floor. 4. With gentle tension and even breathing, hold this position for 5-10 seconds. Cat-cow Repeat these steps until your lower back becomes more flexible: 1. Get into a hands-and-knees position on a firm surface. Keep your hands under your shoulders, and keep your knees under your hips. You may place padding under your knees for comfort. 2. Let your head hang down toward your chest. Contract your abdominal muscles and point your tailbone toward the floor so your lower back becomes rounded like the back of a cat. 3. Hold this position for 5 seconds. 4. Slowly lift your head, let your abdominal muscles relax and point your tailbone up toward the ceiling so your back forms a sagging arch like the back of a cow. 5. Hold this position for 5 seconds.  Press-ups Repeat these steps 5-10 times: 1. Lie on your abdomen (face-down) on the floor. 2. Place your palms near your head, about shoulder-width apart. 3. Keeping your back as relaxed as possible and keeping your hips on the floor, slowly straighten your arms to raise the top half of your body and lift your shoulders. Do not use your back muscles to raise your upper torso. You may adjust the placement of your hands to make yourself more comfortable. 4. Hold this position for 5 seconds while you keep your back relaxed. 5. Slowly return to lying  flat on the floor.  Bridges Repeat these steps 10 times: 1. Lie on your back on a firm surface. 2. Bend your knees so they are pointing toward the ceiling and your feet are flat on the floor. Your arms should be flat at your sides, next to your body. 3. Tighten your buttocks muscles and lift your buttocks off the floor until your waist is at almost the same height as your knees. You should feel the muscles working in your buttocks and the back of your thighs. If you do not feel these muscles, slide your feet 1-2 inches farther away from your buttocks. 4. Hold this position for 3-5 seconds. 5. Slowly lower your hips to the starting position, and allow your buttocks muscles to relax completely. If this exercise is too easy, try doing it with your arms crossed over your chest. Abdominal crunches Repeat these steps 5-10 times: 1. Lie on your back on a firm bed or the floor with your legs extended. 2. Bend your knees so they are pointing toward the ceiling and your feet are flat on the floor. 3. Cross your arms over your chest. 4. Tip your chin slightly toward your chest without bending your neck. 5. Tighten your abdominal muscles and slowly raise your trunk (torso) high enough to lift your shoulder blades a tiny bit off the floor. Avoid raising your torso higher than that because it can put too much stress on your low back and does not help to strengthen your abdominal muscles. 6. Slowly return to your starting position. Back lifts Repeat these steps 5-10 times: 1. Lie on your abdomen (face-down) with your arms at your sides, and rest your forehead on the floor. 2. Tighten the muscles in your legs and your buttocks. 3.  Slowly lift your chest off the floor while you keep your hips pressed to the floor. Keep the back of your head in line with the curve in your back. Your eyes should be looking at the floor. 4. Hold this position for 3-5 seconds. 5. Slowly return to your starting position. Contact  a health care provider if:  Your back pain or discomfort gets much worse when you do an exercise.  Your worsening back pain or discomfort does not lessen within 2 hours after you exercise. If you have any of these problems, stop doing these exercises right away. Do not do them again unless your health care provider says that you can. Get help right away if:  You develop sudden, severe back pain. If this happens, stop doing the exercises right away. Do not do them again unless your health care provider says that you can. This information is not intended to replace advice given to you by your health care provider. Make sure you discuss any questions you have with your health care provider. Document Revised: 10/11/2018 Document Reviewed: 03/08/2018 Elsevier Patient Education  2020 [employer].  Cholesterol Content in Foods Cholesterol is a waxy, fat-like substance that helps to carry fat in the blood. The body needs cholesterol in small amounts, but too much cholesterol can cause damage to the arteries and heart. Most people should eat less than 200 milligrams (mg) of cholesterol a day. Foods with cholesterol  Cholesterol is found in animal-based foods, such as meat, seafood, and dairy. Generally, low-fat dairy and lean meats have less cholesterol than full-fat dairy and fatty meats. The milligrams of cholesterol per serving (mg per serving) of common cholesterol-containing foods are listed below. Meat and other proteins  Egg -- one large whole egg has 186 mg.  Veal shank -- 4 oz has 141 mg.  Lean ground [place] (93% lean) -- 4 oz has 118 mg.  Fat-trimmed lamb loin -- 4 oz has 106 mg.  Lean ground beef (90% lean) -- 4 oz has 100 mg.  Lobster -- 3.5 oz has 90 mg.  Pork loin chops -- 4 oz has 86 mg.  Canned salmon -- 3.5 oz has 83 mg.  Fat-trimmed beef top loin -- 4 oz has 78 mg.  Frankfurter -- 1 frank (3.5 oz) has 77 mg.  Crab -- 3.5 oz has 71 mg.  Roasted chicken without  skin, white meat -- 4 oz has 66 mg.  Light bologna -- 2 oz has 45 mg.  Deli-cut [place] -- 2 oz has 31 mg.  Canned tuna -- 3.5 oz has 31 mg.  Tomasa Blase -- 1 oz has 29 mg.  Oysters and mussels (raw) -- 3.5 oz has 25 mg.  Mackerel -- 1 oz has 22 mg.  Trout -- 1 oz has 20 mg.  Pork sausage -- 1 link (1 oz) has 17 mg.  Salmon -- 1 oz has 16 mg.  Tilapia -- 1 oz has 14 mg. Dairy  Soft-serve ice cream --  cup (4 oz) has 103 mg.  Whole-milk yogurt -- 1 cup (8 oz) has 29 mg.  Cheddar cheese -- 1 oz has 28 mg.  American cheese -- 1 oz has 28 mg.  Whole milk -- 1 cup (8 oz) has 23 mg.  2% milk -- 1 cup (8 oz) has 18 mg.  Cream cheese -- 1 tablespoon (Tbsp) has 15 mg.  Cottage cheese --  cup (4 oz) has 14 mg.  Low-fat (1%) milk -- 1 cup (8  oz) has 10 mg.  Sour cream -- 1 Tbsp has 8.5 mg.  Low-fat yogurt -- 1 cup (8 oz) has 8 mg.  Nonfat Greek yogurt -- 1 cup (8 oz) has 7 mg.  Half-and-half cream -- 1 Tbsp has 5 mg. Fats and oils  Cod liver oil -- 1 tablespoon (Tbsp) has 82 mg.  Butter -- 1 Tbsp has 15 mg.  Lard -- 1 Tbsp has 14 mg.  Bacon grease -- 1 Tbsp has 14 mg.  Mayonnaise -- 1 Tbsp has 5-10 mg.  Margarine -- 1 Tbsp has 3-10 mg. Exact amounts of cholesterol in these foods may vary depending on specific ingredients and brands. Foods without cholesterol Most plant-based foods do not have cholesterol unless you combine them with a food that has cholesterol. Foods without cholesterol include:  Grains and cereals.  Vegetables.  Fruits.  Vegetable oils, such as olive, canola, and sunflower oil.  Legumes, such as peas, beans, and lentils.  Nuts and seeds.  Egg whites. Summary  The body needs cholesterol in small amounts, but too much cholesterol can cause damage to the arteries and heart.  Most people should eat less than 200 milligrams (mg) of cholesterol a day. This information is not intended to replace advice given to you by your health care  provider. Make sure you discuss any questions you have with your health care provider. Document Revised: 05/19/2017 Document Reviewed: 01/31/2017 Elsevier Patient Education  2020 [employer] Inc.   High Cholesterol  High cholesterol is a condition in which the blood has high levels of a white, waxy, fat-like substance (cholesterol). The human body needs small amounts of cholesterol. The liver makes all the cholesterol that the body needs. Extra (excess) cholesterol comes from the food that we eat. Cholesterol is carried from the liver by the blood through the blood vessels. If you have high cholesterol, deposits (plaques) may build up on the walls of your blood vessels (arteries). Plaques make the arteries narrower and stiffer. Cholesterol plaques increase your risk for heart attack and stroke. Work with your health care provider to keep your cholesterol levels in a healthy range. What increases the risk? This condition is more likely to develop in people who:  Eat foods that are high in animal fat (saturated fat) or cholesterol.  Are overweight.  Are not getting enough exercise.  Have a family history of high cholesterol. What are the signs or symptoms? There are no symptoms of this condition. How is this diagnosed? This condition may be diagnosed from the results of a blood test.  If you are older than age 62, your health care provider may check your cholesterol every 4-6 years.  You may be checked more often if you already have high cholesterol or other risk factors for heart disease. The blood test for cholesterol measures:  "Bad" cholesterol (LDL cholesterol). This is the main type of cholesterol that causes heart disease. The desired level for LDL is less than 100.  "Good" cholesterol (HDL cholesterol). This type helps to protect against heart disease by cleaning the arteries and carrying the LDL away. The desired level for HDL is 60 or higher.  Triglycerides. These are fats that  the body can store or burn for energy. The desired number for triglycerides is lower than 150.  Total cholesterol. This is a measure of the total amount of cholesterol in your blood, including LDL cholesterol, HDL cholesterol, and triglycerides. A healthy number is less than 200. How is this treated? This condition is  treated with diet changes, lifestyle changes, and medicines. Diet changes  This may include eating more whole grains, fruits, vegetables, nuts, and fish.  This may also include cutting back on red meat and foods that have a lot of added sugar. Lifestyle changes  Changes may include getting at least 40 minutes of aerobic exercise 3 times a week. Aerobic exercises include walking, biking, and swimming. Aerobic exercise along with a healthy diet can help you maintain a healthy weight.  Changes may also include quitting smoking. Medicines  Medicines are usually given if diet and lifestyle changes have failed to reduce your cholesterol to healthy levels.  Your health care provider may prescribe a statin medicine. Statin medicines have been shown to reduce cholesterol, which can reduce the risk of heart disease. Follow these instructions at home: Eating and drinking If told by your health care provider:  Eat chicken (without skin), fish, veal, shellfish, ground [place] breast, and round or loin cuts of red meat.  Do not eat fried foods or fatty meats, such as hot dogs and salami.  Eat plenty of fruits, such as apples.  Eat plenty of vegetables, such as broccoli, potatoes, and carrots.  Eat beans, peas, and lentils.  Eat grains such as barley, rice, couscous, and bulgur wheat.  Eat pasta without cream sauces.  Use skim or nonfat milk, and eat low-fat or nonfat yogurt and cheeses.  Do not eat or drink whole milk, cream, ice cream, egg yolks, or hard cheeses.  Do not eat stick margarine or tub margarines that contain trans fats (also called partially hydrogenated  oils).  Do not eat saturated tropical oils, such as coconut oil and palm oil.  Do not eat cakes, cookies, crackers, or other baked goods that contain trans fats.  General instructions  Exercise as directed by your health care provider. Increase your activity level with activities such as gardening, walking, and taking the stairs.  Take over-the-counter and prescription medicines only as told by your health care provider.  Do not use any products that contain nicotine or tobacco, such as cigarettes and e-cigarettes. If you need help quitting, ask your health care provider.  Keep all follow-up visits as told by your health care provider. This is important. Contact a health care provider if:  You are struggling to maintain a healthy diet or weight.  You need help to start on an exercise program.  You need help to stop smoking. Get help right away if:  You have chest pain.  You have trouble breathing. This information is not intended to replace advice given to you by your health care provider. Make sure you discuss any questions you have with your health care provider. Document Revised: 06/09/2017 Document Reviewed: 12/05/2015 Elsevier Patient Education  2020 [employer].  Allopurinol tablets What is this medicine? ALLOPURINOL (al oh PURE i nole) reduces the amount of uric acid the body makes. It is used to treat the symptoms of gout. It is also used to treat or prevent high uric acid levels that occur as a result of certain types of chemotherapy. This medicine may also help patients who frequently have kidney stones. This medicine may be used for other purposes; ask your health care provider or pharmacist if you have questions. COMMON BRAND NAME(S): Zyloprim What should I tell my health care provider before I take this medicine? They need to know if you have any of these conditions:  kidney disease  liver disease  an unusual or allergic reaction to allopurinol,  other  medicines, foods, dyes, or preservatives  pregnant or trying to get pregnant  breast feeding How should I use this medicine? Take this medicine by mouth with a glass of water. Follow the directions on the prescription label. If this medicine upsets your stomach, take it with food or milk. Take your doses at regular intervals. Do not take your medicine more often than directed. Talk to your pediatrician regarding the use of this medicine in children. Special care may be needed. While this drug may be prescribed for children as young as 6 years for selected conditions, precautions do apply. Overdosage: If you think you have taken too much of this medicine contact a poison control center or emergency room at once. NOTE: This medicine is only for you. Do not share this medicine with others. What if I miss a dose? If you miss a dose, take it as soon as you can. If it is almost time for your next dose, take only that dose. Do not take double or extra doses. What may interact with this medicine? Do not take this medicine with the following medication:  didanosine, ddI This medicine may also interact with the following medications:  certain antibiotics like amoxicillin, ampicillin  certain medicines for cancer  certain medicines for immunosuppression like azathioprine, cyclosporine, mercaptopurine  chlorpropamide  probenecid  thiazide diuretics, like hydrochlorothiazide  sulfinpyrazone  warfarin This list may not describe all possible interactions. Give your health care provider a list of all the medicines, herbs, non-prescription drugs, or dietary supplements you use. Also tell them if you smoke, drink alcohol, or use illegal drugs. Some items may interact with your medicine. What should I watch for while using this medicine? Visit your doctor or healthcare provider for regular checks on your progress. If you are taking this medicine to treat gout, you may not have less frequent attacks  at first. Keep taking your medicine regularly and the attacks should get better within 2 to 6 weeks. Drink plenty of water (10 to 12 full glasses a day) while you are taking this medicine. This will help to reduce stomach upset and reduce the risk of getting gout or kidney stones. Call your doctor or healthcare provider at once if you get a skin rash together with chills, fever, sore throat, or nausea and vomiting, if you have blood in your urine, or difficulty passing urine. This medicine may cause serious skin reactions. They can happen weeks to months after starting the medicine. Contact your healthcare provider right away if you notice fevers or flu-like symptoms with a rash. The rash may be red or purple and then turn into blisters or peeling of the skin. Or, you might notice a red rash with swelling of the face, lips or lymph nodes in your neck or under your arms. Do not take vitamin C without asking your doctor or healthcare provider. Too much vitamin C can increase the chance of getting kidney stones. You may get drowsy or dizzy. Do not drive, use machinery, or do anything that needs mental alertness until you know how this drug affects you. Do not stand or sit up quickly, especially if you are an older patient. This reduces the risk of dizzy or fainting spells. Alcohol can make you more drowsy and dizzy. Alcohol can also increase the chance of stomach problems and increase the amount of uric acid in your blood. Avoid alcoholic drinks. What side effects may I notice from receiving this medicine? Side effects that you should report  to your doctor or health care professional as soon as possible:  allergic reactions like skin rash, itching or hives, swelling of the face, lips, or tongue  breathing problems  joint pain  muscle pain  rash, fever, and swollen lymph nodes  redness, blistering, peeling, or loosening of the skin, including inside the mouth  signs and symptoms of infection like  fever or chills; cough; sore throat  signs and symptoms of kidney injury like trouble passing urine or change in the amount of urine, flank pain  tingling, numbness in the hands or feet  unusual bleeding or bruising  unusually weak or tired Side effects that usually do not require medical attention (report to your doctor or health care professional if they continue or are bothersome):  changes in taste  diarrhea  drowsiness  headache  nausea, vomiting  stomach upset This list may not describe all possible side effects. Call your doctor for medical advice about side effects. You may report side effects to FDA at 1-800-FDA-1088. Where should I keep my medicine? Keep out of the reach of children. Store at room temperature between 15 and 25 degrees C (59 and 77 degrees F). Protect from light and moisture. Throw away any unused medicine after the expiration date. NOTE: This sheet is a summary. It may not cover all possible information. If you have questions about this medicine, talk to your doctor, pharmacist, or health care provider.  2020 Elsevier/Gold Standard (2018-08-28 09:41:46)    Gout  Gout is a condition that causes painful swelling of the joints. Gout is a type of inflammation of the joints (arthritis). This condition is caused by having too much uric acid in the body. Uric acid is a chemical that forms when the body breaks down substances called purines. Purines are important for building body proteins. When the body has too much uric acid, sharp crystals can form and build up inside the joints. This causes pain and swelling. Gout attacks can happen quickly and may be very painful (acute gout). Over time, the attacks can affect more joints and become more frequent (chronic gout). Gout can also cause uric acid to build up under the skin and inside the kidneys. What are the causes? This condition is caused by too much uric acid in your blood. This can happen because:  Your  kidneys do not remove enough uric acid from your blood. This is the most common cause.  Your body makes too much uric acid. This can happen with some cancers and cancer treatments. It can also occur if your body is breaking down too many red blood cells (hemolytic anemia).  You eat too many foods that are high in purines. These foods include organ meats and some seafood. Alcohol, especially beer, is also high in purines. A gout attack may be triggered by trauma or stress. What increases the risk? You are more likely to develop this condition if you:  Have a family history of gout.  Are male and middle-aged.  Are male and have gone through menopause.  Are obese.  Frequently drink alcohol, especially beer.  Are dehydrated.  Lose weight too quickly.  Have an organ transplant.  Have lead poisoning.  Take certain medicines, including aspirin, cyclosporine, diuretics, levodopa, and niacin.  Have kidney disease.  Have a skin condition called psoriasis. What are the signs or symptoms? An attack of acute gout happens quickly. It usually occurs in just one joint. The most common place is the big toe. Attacks often start  at night. Other joints that may be affected include joints of the feet, ankle, knee, fingers, wrist, or elbow. Symptoms of this condition may include:  Severe pain.  Warmth.  Swelling.  Stiffness.  Tenderness. The affected joint may be very painful to touch.  Shiny, red, or purple skin.  Chills and fever. Chronic gout may cause symptoms more frequently. More joints may be involved. You may also have white or yellow lumps (tophi) on your hands or feet or in other areas near your joints. How is this diagnosed? This condition is diagnosed based on your symptoms, medical history, and physical exam. You may have tests, such as:  Blood tests to measure uric acid levels.  Removal of joint fluid with a thin needle (aspiration) to look for uric acid  crystals.  X-rays to look for joint damage. How is this treated? Treatment for this condition has two phases: treating an acute attack and preventing future attacks. Acute gout treatment may include medicines to reduce pain and swelling, including:  NSAIDs.  Steroids. These are strong anti-inflammatory medicines that can be taken by mouth (orally) or injected into a joint.  Colchicine. This medicine relieves pain and swelling when it is taken soon after an attack. It can be given by mouth or through an IV. Preventive treatment may include:  Daily use of smaller doses of NSAIDs or colchicine.  Use of a medicine that reduces uric acid levels in your blood.  Changes to your diet. You may need to see a dietitian about what to eat and drink to prevent gout. Follow these instructions at home: During a gout attack   If directed, put ice on the affected area: ? Put ice in a plastic bag. ? Place a towel between your skin and the bag. ? Leave the ice on for 20 minutes, 2-3 times a day.  Raise (elevate) the affected joint above the level of your heart as often as possible.  Rest the joint as much as possible. If the affected joint is in your leg, you may be given crutches to use.  Follow instructions from your health care provider about eating or drinking restrictions. Avoiding future gout attacks  Follow a low-purine diet as told by your dietitian or health care provider. Avoid foods and drinks that are high in purines, including liver, kidney, anchovies, asparagus, herring, mushrooms, mussels, and beer.  Maintain a healthy weight or lose weight if you are overweight. If you want to lose weight, talk with your health care provider. It is important that you do not lose weight too quickly.  Start or maintain an exercise program as told by your health care provider. Eating and drinking  Drink enough fluids to keep your urine pale yellow.  If you drink alcohol: ? Limit how much you use  to:  0-1 drink a day for women.  0-2 drinks a day for men. ? Be aware of how much alcohol is in your drink. In the [place] one drink equals one 12 oz bottle of beer (355 mL) one 5 oz glass of wine (148 mL), or one 1 oz glass of hard liquor (44 mL). General instructions  Take over-the-counter and prescription medicines only as told by your health care provider.  Do not drive or use heavy machinery while taking prescription pain medicine.  Return to your normal activities as told by your health care provider. Ask your health care provider what activities are safe for you.  Keep all follow-up visits as told by your  health care provider. This is important. Contact a health care provider if you have:  Another gout attack.  Continuing symptoms of a gout attack after 10 days of treatment.  Side effects from your medicines.  Chills or a fever.  Burning pain when you urinate.  Pain in your lower back or belly. Get help right away if you:  Have severe or uncontrolled pain.  Cannot urinate. Summary  Gout is painful swelling of the joints caused by inflammation.  The most common site of pain is the big toe, but it can affect other joints in the body.  Medicines and dietary changes can help to prevent and treat gout attacks. This information is not intended to replace advice given to you by your health care provider. Make sure you discuss any questions you have with your health care provider. Document Revised: 12/27/2017 Document Reviewed: 12/27/2017 Elsevier Patient Education  2020 Elsevier Inc.   Low-Purine Eating Plan A low-purine eating plan involves making food choices to limit your intake of purine. Purine is a kind of uric acid. Too much uric acid in your blood can cause certain conditions, such as gout and kidney stones. Eating a low-purine diet can help control these conditions. What are tips for following this plan? Reading food labels   Avoid foods with saturated or  Trans fat.  Check the ingredient list of grains-based foods, such as bread and cereal, to make sure that they contain whole grains.  Check the ingredient list of sauces or soups to make sure they do not contain meat or fish.  When choosing soft drinks, check the ingredient list to make sure they do not contain high-fructose corn syrup. Shopping  Buy plenty of fresh fruits and vegetables.  Avoid buying canned or fresh fish.  Buy dairy products labeled as low-fat or nonfat.  Avoid buying premade or processed foods. These foods are often high in fat, salt (sodium), and added sugar. Cooking  Use olive oil instead of butter when cooking. Oils like olive oil, canola oil, and sunflower oil contain healthy fats. Meal planning  Learn which foods do or do not affect you. If you find out that a food tends to cause your gout symptoms to flare up, avoid eating that food. You can enjoy foods that do not cause problems. If you have any questions about a food item, talk with your dietitian or health care provider.  Limit foods high in fat, especially saturated fat. Fat makes it harder for your body to get rid of uric acid.  Choose foods that are lower in fat and are lean sources of protein. General guidelines  Limit alcohol intake to no more than 1 drink a day for nonpregnant women and 2 drinks a day for men. One drink equals 12 oz of beer, 5 oz of wine, or 1 oz of hard liquor. Alcohol can affect the way your body gets rid of uric acid.  Drink plenty of water to keep your urine clear or pale yellow. Fluids can help remove uric acid from your body.  If directed by your health care provider, take a vitamin C supplement.  Work with your health care provider and dietitian to develop a plan to achieve or maintain a healthy weight. Losing weight can help reduce uric acid in your blood. What foods are recommended? The items listed may not be a complete list. Talk with your dietitian about what dietary  choices are best for you. Foods low in purines Foods low in purines  do not need to be limited. These include:  All fruits.  All low-purine vegetables, pickles, and olives.  Breads, pasta, rice, cornbread, and popcorn. Cake and other baked goods.  All dairy foods.  Eggs, nuts, and nut butters.  Spices and condiments, such as salt, herbs, and vinegar.  Plant oils, butter, and margarine.  Water, sugar-free soft drinks, tea, coffee, and cocoa.  Vegetable-based soups, broths, sauces, and gravies. Foods moderate in purines Foods moderate in purines should be limited to the amounts listed.   cup of asparagus, cauliflower, spinach, mushrooms, or green peas, each day.  2/3 cup uncooked oatmeal, each day.   cup dry wheat bran or wheat germ, each day.  2-3 ounces of meat or poultry, each day.  4-6 ounces of shellfish, such as crab, lobster, oysters, or shrimp, each day.  1 cup cooked beans, peas, or lentils, each day.  Soup, broths, or bouillon made from meat or fish. Limit these foods as much as possible. What foods are not recommended? The items listed may not be a complete list. Talk with your dietitian about what dietary choices are best for you. Limit your intake of foods high in purines, including:  Beer and other alcohol.  Meat-based gravy or sauce.  Canned or fresh fish, such as: ? Anchovies, sardines, herring, and tuna. ? Mussels and scallops. ? Codfish, trout, and haddock.  Tomasa Blase.  Organ meats, such as: ? Liver or kidney. ? Tripe. ? Sweetbreads (thymus gland or pancreas).  Wild Education officer, environmental.  Yeast or yeast extract supplements.  Drinks sweetened with high-fructose corn syrup. Summary  Eating a low-purine diet can help control conditions caused by too much uric acid in the body, such as gout or kidney stones.  Choose low-purine foods, limit alcohol, and limit foods high in fat.  You will learn over time which foods do or do not affect you. If you  find out that a food tends to cause your gout symptoms to flare up, avoid eating that food. This information is not intended to replace advice given to you by your health care provider. Make sure you discuss any questions you have with your health care provider. Document Revised: 05/19/2017 Document Reviewed: 07/20/2016 Elsevier Patient Education  2020 [employer].

## 2019-10-14 ENCOUNTER — Encounter: Payer: Self-pay | Admitting: Internal Medicine

## 2019-10-15 ENCOUNTER — Encounter: Payer: Self-pay | Admitting: Internal Medicine

## 2019-10-25 ENCOUNTER — Other Ambulatory Visit: Payer: Self-pay

## 2019-10-25 ENCOUNTER — Telehealth: Payer: Self-pay | Admitting: Internal Medicine

## 2019-10-25 ENCOUNTER — Ambulatory Visit
Admission: RE | Admit: 2019-10-25 | Discharge: 2019-10-25 | Disposition: A | Discharge status: Home / Self Care | Payer: Managed Care, Other (non HMO) | Source: Ambulatory Visit | Attending: Internal Medicine | Admitting: Internal Medicine

## 2019-10-25 DIAGNOSIS — R609 Edema, unspecified: Secondary | ICD-10-CM | POA: Diagnosis not present

## 2019-10-25 DIAGNOSIS — I10 Essential (primary) hypertension: Secondary | ICD-10-CM | POA: Insufficient documentation

## 2019-10-25 NOTE — Telephone Encounter (Signed)
[facility] Left msg for ofc to call regarding pt appt.

## 2019-10-25 NOTE — Progress Notes (Signed)
*  PRELIMINARY RESULTS* Echocardiogram 2D Echocardiogram has been performed.  Cristela Blue 10/25/2019, 11:34 AM

## 2019-10-29 ENCOUNTER — Encounter: Payer: Self-pay | Admitting: Internal Medicine

## 2019-10-29 NOTE — Progress Notes (Signed)
Cologuard form filled out and faxed along with demographics and insurance card info.

## 2019-10-31 ENCOUNTER — Encounter: Payer: Self-pay | Admitting: Internal Medicine

## 2019-11-09 ENCOUNTER — Encounter: Payer: Self-pay | Admitting: Internal Medicine

## 2019-11-11 ENCOUNTER — Encounter: Payer: Self-pay | Admitting: Internal Medicine

## 2019-11-11 DIAGNOSIS — K7689 Other specified diseases of liver: Secondary | ICD-10-CM | POA: Insufficient documentation

## 2019-11-11 LAB — COLOGUARD: Cologuard: NEGATIVE

## 2019-11-19 ENCOUNTER — Telehealth: Payer: Self-pay | Admitting: Internal Medicine

## 2019-11-19 ENCOUNTER — Encounter: Payer: Self-pay | Admitting: Internal Medicine

## 2019-11-19 NOTE — Telephone Encounter (Signed)
cologuard negative 11/11/19 thanks for doing this will repeat in 3 years

## 2019-11-20 NOTE — Telephone Encounter (Signed)
Patient informed and verbalized understanding

## 2019-12-06 ENCOUNTER — Telehealth: Payer: Self-pay | Admitting: Internal Medicine

## 2019-12-06 ENCOUNTER — Encounter: Payer: Self-pay | Admitting: Internal Medicine

## 2019-12-06 NOTE — Telephone Encounter (Signed)
Left vm with appt time date and location Dr Lew Dawes and number to resch if needed @ [phone]. 12/12/2019 @1 :45pm

## 2020-01-02 ENCOUNTER — Other Ambulatory Visit: Payer: Self-pay

## 2020-01-02 ENCOUNTER — Ambulatory Visit (INDEPENDENT_AMBULATORY_CARE_PROVIDER_SITE_OTHER): Payer: Managed Care, Other (non HMO) | Admitting: Dermatology

## 2020-01-02 DIAGNOSIS — L820 Inflamed seborrheic keratosis: Secondary | ICD-10-CM

## 2020-01-02 DIAGNOSIS — L578 Other skin changes due to chronic exposure to nonionizing radiation: Secondary | ICD-10-CM | POA: Diagnosis not present

## 2020-01-02 NOTE — Progress Notes (Signed)
   New Patient Visit  Subjective  Vincent Hayes is a 64 y.o. male who presents for the following: growth (R cheek, has had for yrs, started change size ~42m ago, no other symptoms, no hx of skin ca).  The following portions of the chart were reviewed this encounter and updated as appropriate:  Tobacco  Allergies  Meds  Problems  Med Hx  Surg Hx  Fam Hx     Review of Systems:  No other skin or systemic complaints except as noted in HPI or Assessment and Plan.  Objective  Well appearing patient in no apparent distress; mood and affect are within normal limits.  A focused examination was performed including face. Relevant physical exam findings are noted in the Assessment and Plan.  Objective  Right cheek x 1, nose x 1 (2): Erythematous keratotic or waxy stuck-on plaque 1.5cm R cheek, L nose   Assessment & Plan    Inflamed seborrheic keratosis (2) Right cheek x 1, nose x 1  Destruction of lesion - Right cheek x 1, nose x 1 Complexity: simple   Destruction method: cryotherapy   Informed consent: discussed and consent obtained   Timeout:  patient name, date of birth, surgical site, and procedure verified Lesion destroyed using liquid nitrogen: Yes   Region frozen until ice ball extended beyond lesion: Yes   Outcome: patient tolerated procedure well with no complications   Post-procedure details: wound care instructions given    Actinic Damage - diffuse scaly erythematous macules with underlying dyspigmentation - Recommend daily broad spectrum sunscreen SPF 30+ to sun-exposed areas, reapply every 2 hours as needed.  - Call for new or changing lesions.  Return in about 2 months (around 03/04/2020) for recheck ISK.  I, Ardis Rowan, RMA, am acting as scribe for Armida Sans, MD .  Documentation: I have reviewed the above documentation for accuracy and completeness, and I agree with the above.  Armida Sans, MD

## 2020-01-02 NOTE — Patient Instructions (Signed)

## 2020-01-06 ENCOUNTER — Encounter: Payer: Self-pay | Admitting: Dermatology

## 2020-02-25 ENCOUNTER — Telehealth: Payer: Self-pay | Admitting: Internal Medicine

## 2020-02-25 NOTE — Telephone Encounter (Signed)
I left vm for pt to call ofc regarding echo and ins. Also verifying if he has medicare. Thanks

## 2020-03-02 NOTE — Telephone Encounter (Signed)
I spoke with pt about echo pt stated the code that should have been on order is I10.0 and R60.9 I verified codes on order and the codes are correct. Pt states when he spoke back to [facility]/code he was advised that it's in review.

## 2020-03-02 NOTE — Telephone Encounter (Signed)
Patient was returning call for echo and ins

## 2020-03-11 ENCOUNTER — Ambulatory Visit: Payer: Managed Care, Other (non HMO) | Admitting: Dermatology

## 2020-03-17 ENCOUNTER — Telehealth: Payer: Self-pay | Admitting: Internal Medicine

## 2020-03-17 NOTE — Telephone Encounter (Signed)
He can get pfizer booster 6-8 months after 2nd dose [facility] online

## 2020-03-17 NOTE — Telephone Encounter (Signed)
Please advise 

## 2020-03-17 NOTE — Telephone Encounter (Signed)
Pt wanted to know if Dr.tracy recommended  him to get a Covid booster shot

## 2020-03-17 NOTE — Telephone Encounter (Signed)
Patient informed and verbalized understanding

## 2020-03-24 ENCOUNTER — Encounter: Payer: Self-pay | Admitting: Internal Medicine

## 2020-04-10 DIAGNOSIS — Z0100 Encounter for examination of eyes and vision without abnormal findings: Secondary | ICD-10-CM | POA: Diagnosis not present

## 2020-04-20 ENCOUNTER — Other Ambulatory Visit: Payer: Self-pay | Admitting: Internal Medicine

## 2020-04-20 DIAGNOSIS — I10 Essential (primary) hypertension: Secondary | ICD-10-CM

## 2020-04-20 DIAGNOSIS — R609 Edema, unspecified: Secondary | ICD-10-CM

## 2020-04-20 MED ORDER — FUROSEMIDE 20 MG PO TABS: 20.0000 mg | ORAL_TABLET | Freq: Every day | ORAL | 1 refills | Status: DC

## 2020-04-20 MED ORDER — SPIRONOLACTONE 50 MG PO TABS
50.0000 mg | ORAL_TABLET | Freq: Every day | ORAL | 1 refills | Status: DC
Start: 2020-04-20 — End: 2020-12-09

## 2020-04-20 NOTE — Telephone Encounter (Signed)
Refills sent to [facility] 

## 2020-10-15 ENCOUNTER — Encounter: Payer: Self-pay | Admitting: Internal Medicine

## 2020-10-21 ENCOUNTER — Encounter: Payer: Self-pay | Admitting: Internal Medicine

## 2020-12-09 ENCOUNTER — Other Ambulatory Visit: Payer: Self-pay | Admitting: Internal Medicine

## 2020-12-09 DIAGNOSIS — R609 Edema, unspecified: Secondary | ICD-10-CM

## 2020-12-09 DIAGNOSIS — I10 Essential (primary) hypertension: Secondary | ICD-10-CM

## 2021-06-23 IMAGING — MR MR LUMBAR SPINE W/O CM
5 series · 30 of 48 positions shown · non-contrast
Comparison: None.

CLINICAL DATA: Left hip, buttock and thigh pain since [REDACTED].

EXAM:
MRI LUMBAR SPINE WITHOUT CONTRAST
TECHNIQUE: Multiplanar, multisequence MR imaging of the lumbar spine was
performed. No intravenous contrast was administered.

[Series 5: T2 · sagittal · 4.0mm · 0.81mm/px · 6 of 15 slices shown (1 of 2)]
[im 1/15]
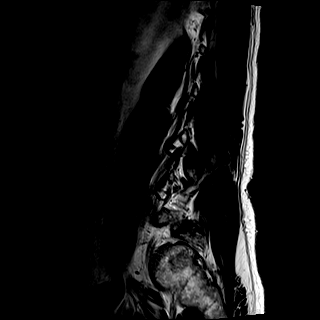
[im 3/15]
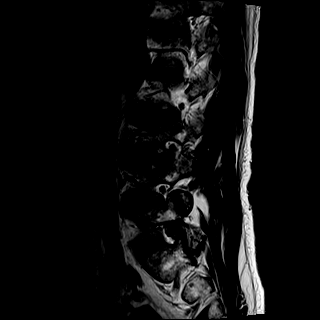
[im 6/15]
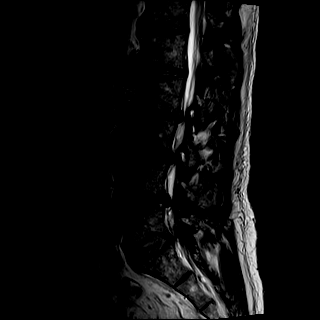
[im 9/15]
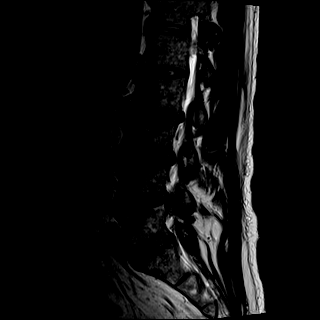
[im 12/15]
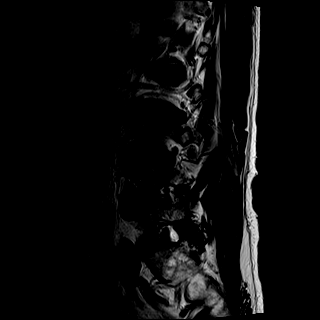
[im 15/15]
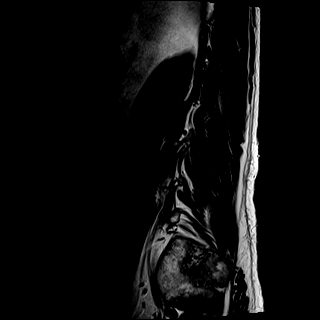

[Series 6: T1 · sagittal · 4.0mm · 0.81mm/px · 7 of 15 slices shown (1 of 2)]
[im 1/15]
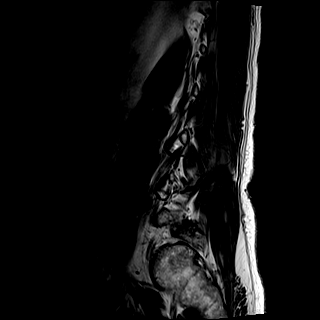
[im 3/15]
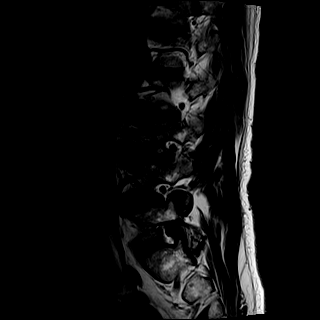
[im 5/15]
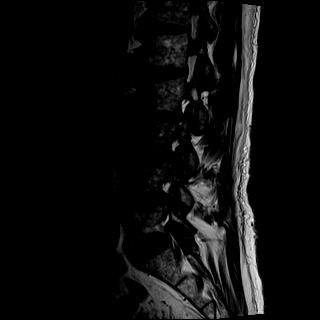
[im 8/15]
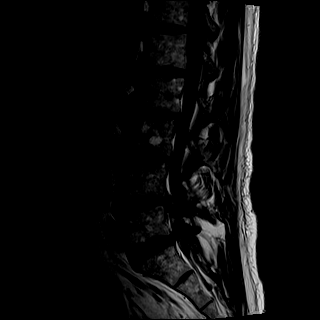
[im 10/15]
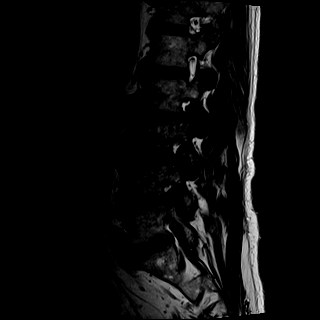
[im 12/15]
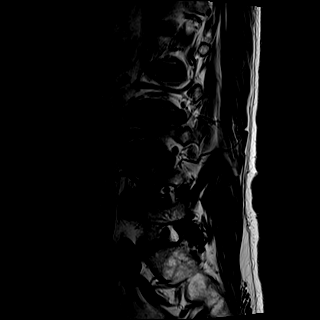
[im 15/15]
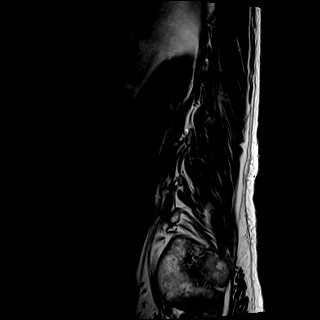

[Series 7: STIR · sagittal · 4.0mm · 0.41mm/px · 1 of 15 slices shown]
[im 1/15]
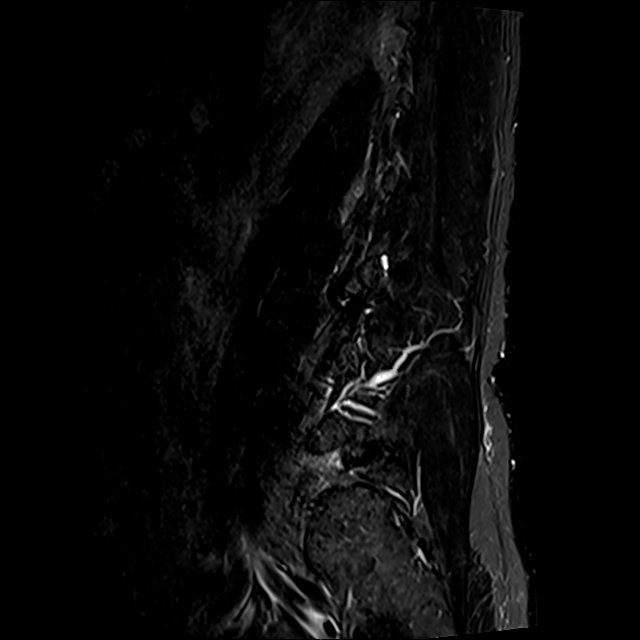

[Series 8: T2 · axial · 4.0mm · 0.78mm/px · z∈[-116,+67]mm · 8 of 30 slices shown (2 of 2)]
[im 1/30]
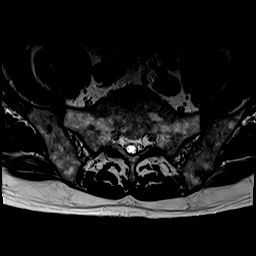
[im 5/30]
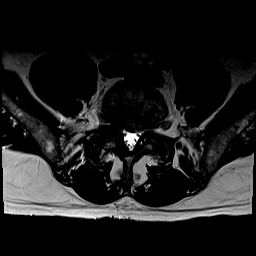
[im 9/30]
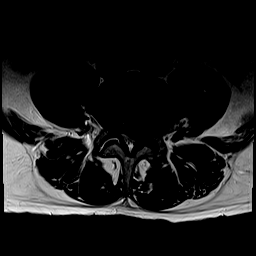
[im 14/30]
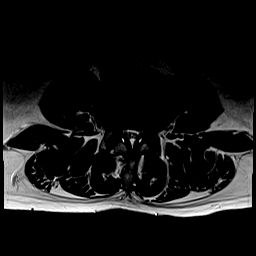
[im 16/30]
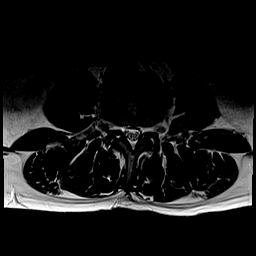
[im 21/30]
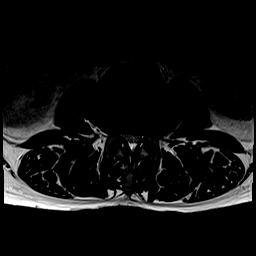
[im 25/30]
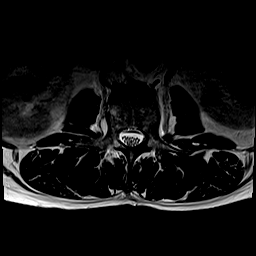
[im 30/30]
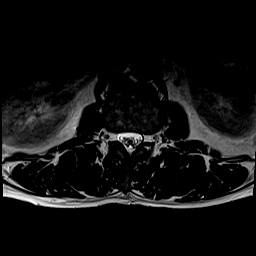

[Series 9: T1 · axial · 4.0mm · 0.39mm/px · z∈[-116,+67]mm · 8 of 30 slices shown (2 of 2)]
[im 1/30]
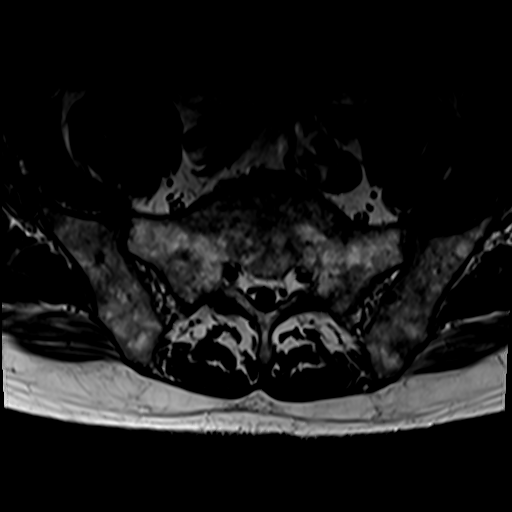
[im 5/30]
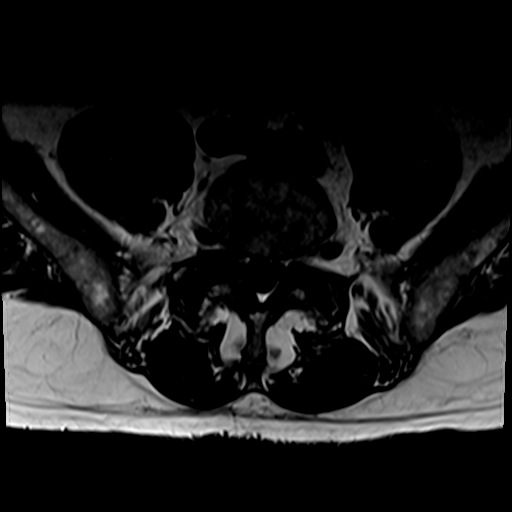
[im 9/30]
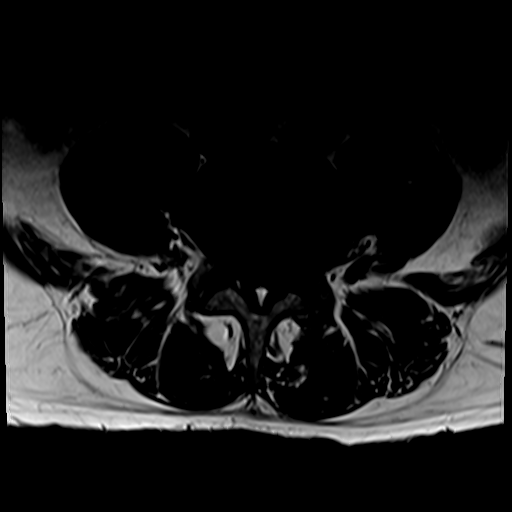
[im 14/30]
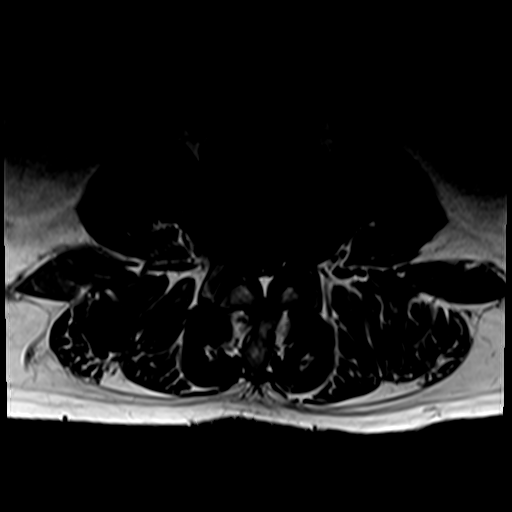
[im 16/30]
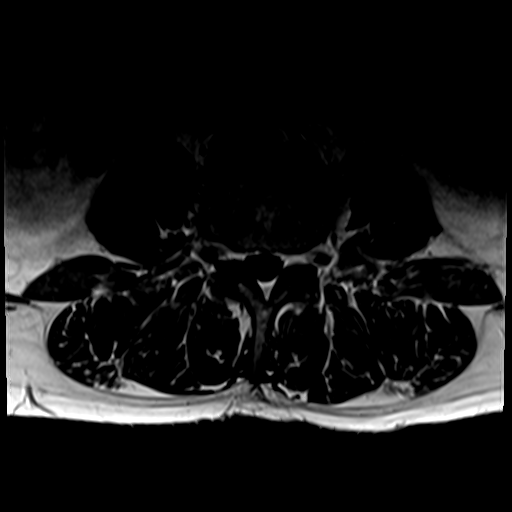
[im 21/30]
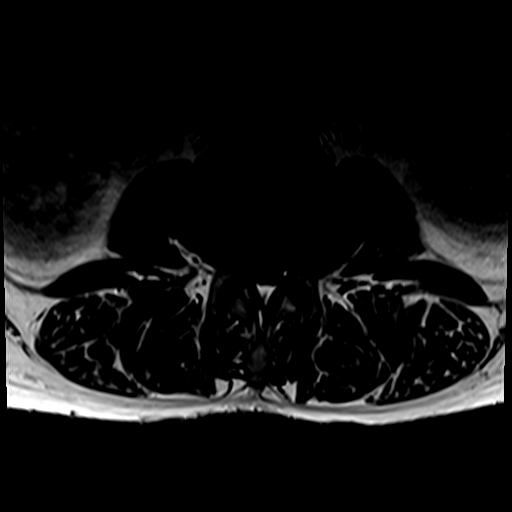
[im 25/30]
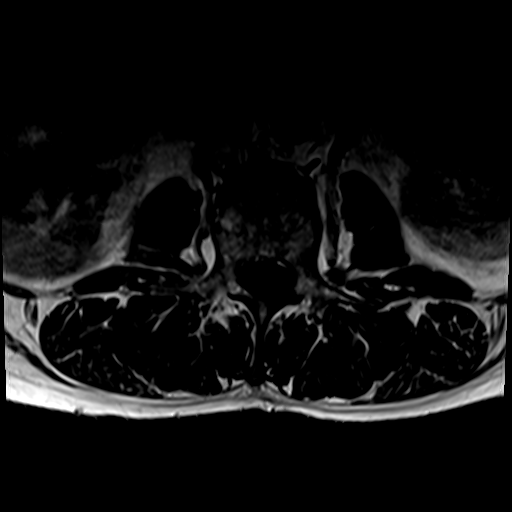
[im 30/30]
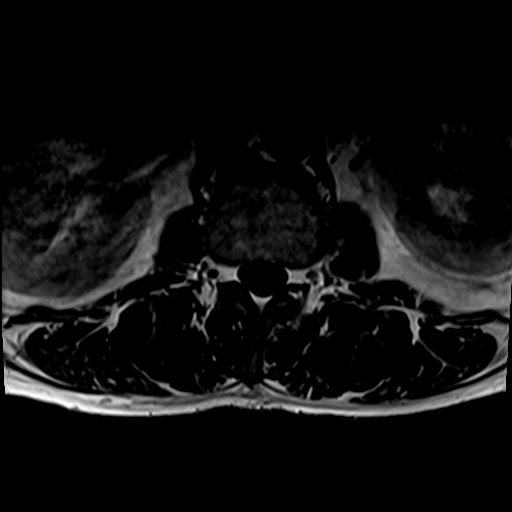

[30 of 48 positions shown; findings below may reference images not displayed]

FINDINGS: Segmentation:  5 lumbar type vertebral bodies assumed.

Alignment:  Straightening of the normal cervical lordosis.

Vertebrae:  No fracture or primary bone lesion.

Conus medullaris and cauda equina: Conus extends to the L1 level.
Conus and cauda equina appear normal.

Paraspinal and other soft tissues: Negative

Disc levels:

T12-L1: Normal.

L1-2: Mild bulging of the disc.  No canal or foraminal stenosis.

L2-3: Disc degeneration with loss of disc height. Endplate
osteophytes and bulging of the disc. Mild facet and ligament
hypertrophy. Moderate multifactorial stenosis of the canal. Left
foraminal to extraforaminal encroachment could possibly affect the
L2 nerve.

L3-4: Endplate osteophytes and bulging of the disc. Facet
hypertrophy. Stenosis of both lateral recesses that could cause
neural compression. Foraminal to extraforaminal encroachment by
osteophyte in disc material right more than left could affect either
L3 nerve, more likely the right.

L4-5: Broad-based, left posterolateral predominant disc herniation
with caudal migration in the left lateral recess. Facet and
ligamentous hypertrophy. Severe stenosis at this level which could
cause neural compression on either or both sides. Stenosis worse on
the left because of the caudally migrated disc material.

L5-S1: Mild bulging of the disc. Facet degeneration and hypertrophy
right worse than left. Stenosis of the subarticular lateral recess
and proximal foramen on the right that could cause right-sided
neural compression.
IMPRESSION: L4-5: Broad-based, left posterolateral prominent disc herniation
with caudally migrated disc material in the left lateral recess.
Facet and ligamentous hypertrophy. Severe spinal stenosis,
particularly on the left. Neural compression could occur on either
side, especially the left.

L2-3: Osteophytes and disc bulge. Facet hypertrophy. Moderate canal
stenosis. Left foraminal to extraforaminal encroachment by
osteophyte in disc material could possibly affect the left L2 nerve.

L3-4: Osteophytes and disc bulge. Facet hypertrophy. Stenosis of
both lateral recesses that could cause neural compression. Foraminal
to extraforaminal encroachment by osteophyte in disc material right
more than left could affect either L3 nerve, particularly the right.

L5-S1: Right-sided predominant facet arthropathy which could be a
cause of back pain. Right subarticular lateral recess and proximal
foraminal encroachment could cause right-sided neural compression.

## 2021-08-11 ENCOUNTER — Other Ambulatory Visit: Payer: Self-pay | Admitting: Internal Medicine

## 2021-08-11 DIAGNOSIS — R609 Edema, unspecified: Secondary | ICD-10-CM

## 2021-08-11 DIAGNOSIS — I10 Essential (primary) hypertension: Secondary | ICD-10-CM

## 2021-10-25 ENCOUNTER — Other Ambulatory Visit: Payer: Self-pay | Admitting: Internal Medicine

## 2021-10-25 DIAGNOSIS — I10 Essential (primary) hypertension: Secondary | ICD-10-CM

## 2021-10-25 DIAGNOSIS — R609 Edema, unspecified: Secondary | ICD-10-CM

## 2021-10-25 NOTE — Telephone Encounter (Signed)
LMTCB to see if patient still following with Dr. French Ana & if so needs appointment. ?

## 2021-10-25 NOTE — Telephone Encounter (Signed)
Pt returned call... .. Advised pt of note below... Pt stated that he no longer see Dr. French Ana... Pt stated that he moved to [place]...  ?

## 2022-03-20 ENCOUNTER — Other Ambulatory Visit: Payer: Self-pay | Admitting: Internal Medicine

## 2022-03-20 DIAGNOSIS — R609 Edema, unspecified: Secondary | ICD-10-CM

## 2022-03-20 DIAGNOSIS — I10 Essential (primary) hypertension: Secondary | ICD-10-CM
